# Patient Record
Sex: Female | Born: 1950
Health system: Southern US, Community
[De-identification: ages and names within clinical notes are randomized; demographics above are authoritative.]

## PROBLEM LIST (undated history)

## (undated) DIAGNOSIS — Z923 Personal history of irradiation: Secondary | ICD-10-CM

## (undated) DIAGNOSIS — S060X9A Concussion with loss of consciousness of unspecified duration, initial encounter: Secondary | ICD-10-CM

## (undated) DIAGNOSIS — E785 Hyperlipidemia, unspecified: Secondary | ICD-10-CM

## (undated) DIAGNOSIS — I10 Essential (primary) hypertension: Secondary | ICD-10-CM

## (undated) DIAGNOSIS — C50919 Malignant neoplasm of unspecified site of unspecified female breast: Secondary | ICD-10-CM

## (undated) DIAGNOSIS — F0390 Unspecified dementia without behavioral disturbance: Secondary | ICD-10-CM

## (undated) HISTORY — PX: NO PAST SURGERIES: SHX2092

---
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
## 2000-03-05 ENCOUNTER — Encounter: Admission: RE | Admit: 2000-03-05 | Discharge: 2000-03-05 | Payer: Self-pay | Admitting: Family Medicine

## 2000-03-05 ENCOUNTER — Encounter: Payer: Self-pay | Admitting: Family Medicine

## 2000-04-30 ENCOUNTER — Emergency Department (HOSPITAL_COMMUNITY): Admission: EM | Admit: 2000-04-30 | Discharge: 2000-04-30 | Payer: Self-pay | Admitting: Emergency Medicine

## 2001-12-18 ENCOUNTER — Ambulatory Visit (HOSPITAL_COMMUNITY): Admission: RE | Admit: 2001-12-18 | Discharge: 2001-12-18 | Payer: Self-pay | Admitting: Gastroenterology

## 2004-05-16 ENCOUNTER — Encounter: Admission: RE | Admit: 2004-05-16 | Discharge: 2004-05-16 | Payer: Self-pay | Admitting: Family Medicine

## 2005-11-22 ENCOUNTER — Other Ambulatory Visit: Admission: RE | Admit: 2005-11-22 | Discharge: 2005-11-22 | Payer: Self-pay | Admitting: Family Medicine

## 2009-12-30 ENCOUNTER — Other Ambulatory Visit: Admission: RE | Admit: 2009-12-30 | Discharge: 2009-12-30 | Payer: Self-pay | Admitting: Family Medicine

## 2011-02-17 NOTE — Procedures (Signed)
[facility]  Patient:    Natasha Cobb, Natasha Cobb Visit Number: [id number] MRN: [id number]          Service Type: Attending:  Verlin Grills, M.D. Dictated by:   Verlin Grills, M.D. Proc. Date: 12/18/01   CC:         Blair Heys, M.D., [facility]   Procedure Report  PROCEDURE:  Screening colonoscopy.  REFERRING PHYSICIAN:  Blair Heys, M.D.  PROCEDURE INDICATION:  Natasha Cobb is a 60 year old female, born 1951-04-14.  Her 33 year old sister was diagnosed with colon cancer. Natasha Cobb is scheduled for her first screening colonoscopy with polypectomy to prevent colon cancer.  I discussed with Natasha Cobb the complications associated with colonoscopy and polypectomy, including a 15 per 1000 risk of bleeding and 4 per 1000 risk of colon perforation requiring surgical repair.  Natasha Cobb has signed the operative permit.  ENDOSCOPIST:  Verlin Grills, M.D.  PREMEDICATION:  Versed 10 mg, Demerol 50 mg.  ENDOSCOPE:  Olympus pediatric colonoscope.  DESCRIPTION OF PROCEDURE:  After obtaining informed consent, Natasha Cobb was placed in the left lateral decubitus position.  I administered intravenous Demerol and intravenous Versed to achieve conscious sedation for the procedure.  The patients blood pressure, oxygen saturation, and cardiac rhythm were monitored throughout the procedure and documented in the medical record.  Anal inspection was normal.  Digital rectal exam was normal.  The Olympus pediatric video colonoscope was introduced into the rectum and easily advanced to the cecum.  Colonic preparation for the exam today was excellent.  Natasha Cobb used the Visicol colonic lavage tablets.  RECTUM:  Normal.  SIGMOID COLON AND DESCENDING COLON:  Normal.  SPLENIC FLEXURE:  Normal.  TRANSVERSE COLON:  Normal.  HEPATIC FLEXURE:  Normal.  ASCENDING COLON:  Normal.  CECUM AND ILEOCECAL VALVE:   Normal.  ASSESSMENT: 1. Normal screening proctocolonoscopy to the cecum. 2. No endoscopic evidence for the presence of colorectal neoplasia.  RECOMMENDATIONS:  Repeat colonoscopy in five years. Dictated by:   Verlin Grills, M.D. Attending:  Verlin Grills, M.D. DD:  12/18/01 TD:  12/18/01 Job: [phone] [id number]

## 2012-08-04 ENCOUNTER — Emergency Department (INDEPENDENT_AMBULATORY_CARE_PROVIDER_SITE_OTHER)
Admission: EM | Admit: 2012-08-04 | Discharge: 2012-08-04 | Disposition: A | Discharge status: Home / Self Care | Payer: Self-pay | Source: Home / Self Care | Attending: Emergency Medicine | Admitting: Emergency Medicine

## 2012-08-04 ENCOUNTER — Encounter (HOSPITAL_COMMUNITY): Payer: Self-pay | Admitting: Emergency Medicine

## 2012-08-04 DIAGNOSIS — T148XXA Other injury of unspecified body region, initial encounter: Secondary | ICD-10-CM

## 2012-08-04 DIAGNOSIS — M542 Cervicalgia: Secondary | ICD-10-CM

## 2012-08-04 DIAGNOSIS — M549 Dorsalgia, unspecified: Secondary | ICD-10-CM

## 2012-08-04 HISTORY — DX: Essential (primary) hypertension: I10

## 2012-08-04 MED ORDER — CYCLOBENZAPRINE HCL 10 MG PO TABS: 10.0000 mg | ORAL_TABLET | Freq: Two times a day (BID) | ORAL | Status: DC | PRN

## 2012-08-04 MED ORDER — NAPROXEN 500 MG PO TABS: 500.0000 mg | ORAL_TABLET | Freq: Two times a day (BID) | ORAL | Status: DC

## 2012-08-04 MED ORDER — TRAMADOL HCL 50 MG PO TABS: 50.0000 mg | ORAL_TABLET | Freq: Four times a day (QID) | ORAL | Status: DC | PRN

## 2012-08-04 NOTE — ED Provider Notes (Signed)
Medical screening examination/treatment/procedure(s) were performed by non-physician practitioner and as supervising physician I was immediately available for consultation/collaboration.  Raynald Blend, MD 08/04/12 [phone]
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
## 2013-07-28 ENCOUNTER — Other Ambulatory Visit (HOSPITAL_COMMUNITY)
Admission: RE | Admit: 2013-07-28 | Discharge: 2013-07-28 | Disposition: A | Discharge status: Home / Self Care | Payer: Managed Care, Other (non HMO) | Source: Ambulatory Visit | Attending: Family Medicine | Admitting: Family Medicine

## 2013-07-28 ENCOUNTER — Other Ambulatory Visit: Payer: Self-pay | Admitting: Family Medicine

## 2013-07-28 DIAGNOSIS — Z124 Encounter for screening for malignant neoplasm of cervix: Secondary | ICD-10-CM | POA: Insufficient documentation

## 2015-03-17 ENCOUNTER — Ambulatory Visit
Admission: RE | Admit: 2015-03-17 | Discharge: 2015-03-17 | Disposition: A | Discharge status: Home / Self Care | Payer: Managed Care, Other (non HMO) | Source: Ambulatory Visit | Attending: Family Medicine | Admitting: Family Medicine

## 2015-03-17 ENCOUNTER — Other Ambulatory Visit: Payer: Self-pay | Admitting: Family Medicine

## 2015-03-17 DIAGNOSIS — M25561 Pain in right knee: Secondary | ICD-10-CM

## 2015-08-03 ENCOUNTER — Other Ambulatory Visit: Payer: Self-pay | Admitting: Family Medicine

## 2015-08-03 ENCOUNTER — Other Ambulatory Visit (HOSPITAL_COMMUNITY)
Admission: RE | Admit: 2015-08-03 | Discharge: 2015-08-03 | Disposition: A | Discharge status: Home / Self Care | Payer: Managed Care, Other (non HMO) | Source: Ambulatory Visit | Attending: Family Medicine | Admitting: Family Medicine

## 2015-08-03 DIAGNOSIS — Z01419 Encounter for gynecological examination (general) (routine) without abnormal findings: Secondary | ICD-10-CM | POA: Insufficient documentation

## 2015-08-05 LAB — CYTOLOGY - PAP

## 2015-09-30 ENCOUNTER — Encounter: Payer: Managed Care, Other (non HMO) | Attending: Family Medicine | Admitting: Skilled Nursing Facility1

## 2015-09-30 ENCOUNTER — Encounter: Payer: Self-pay | Admitting: Skilled Nursing Facility1

## 2015-09-30 VITALS — Ht 64.0 in | Wt 170.0 lb

## 2015-09-30 DIAGNOSIS — Z713 Dietary counseling and surveillance: Secondary | ICD-10-CM | POA: Diagnosis not present

## 2015-09-30 DIAGNOSIS — R739 Hyperglycemia, unspecified: Secondary | ICD-10-CM | POA: Insufficient documentation

## 2015-09-30 NOTE — Patient Instructions (Signed)
-  Try to limit your fried foods to twice a week and eventually aim for even less than that -Focus on drinking more water throughout the day -Switch candy to alternative/sugar free  -2 slices of bacon -Try to cut soda down to 2 small cups a day -Every time you drink a soda drink a glass of water -Refer to snack sheet for snack options -Add more vegetables to your meals throughout the day -Small changes, live with the change and when you are used to that change make another change -Try to eat 1 boiled egg in the morning

## 2015-09-30 NOTE — Progress Notes (Signed)
  Medical Nutrition Therapy:  Appt start time: 3:00 end time: 4:00.   Assessment:  Primary concerns today: referred for prediabetes. Pt states her physician referred her and she does not know why she is here. Pt states she loves fried food. Pt lives with her husband. 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 Pt states she gives her father insulin shots every day along with her sister. Pt states she lives with her husband who has severely limited his soda and fried food intake. Dietitian found the Pt found it a little difficult to follow along with the same line of education/questioning.  Pts A1C 6.4.  Preferred Learning Style:   Auditory  Learning Readiness:   Contemplating  MEDICATIONS: See List   DIETARY INTAKE:  Usual eating pattern includes 2 meals and 3 snacks per day.  Everyday foods include none stated.  Avoided foods include none stated.    24-hr recall:  B ( AM): none Snk ( AM): none L ( PM): chicken biscuit Snk ( PM): dessert D ( PM): fish, rice, chicken, potato salad, green beans Snk ( PM): dessert Beverages: soda, water  Usual physical activity: walk 3 days a week for 3 miles  Estimated energy needs: 1600 calories 180 g carbohydrates 120 g protein 44 g fat  Progress Towards Goal(s):  In progress.   Nutritional Diagnosis:  NB-1.1 Food and nutrition-related knowledge deficit As related to newly diagnosed prediabetes.  As evidenced by pt report, incongruent intake of sugary foods, and A1C 6.4.    Intervention:  Nutrition counseling for prediabetes. Dietitian educated the pt on prediabetes/A1C, proper nutrition/balanced meals, and increasing her water consumption. Goals: -Try to limit your fried foods to twice a week and eventually aim for even less than that -Focus on drinking more water throughout the day -Switch candy to alternative/sugar free  -2 slices of bacon -Try to cut soda down to 2 small cups a day -Every time you drink a soda drink a glass of  water -Refer to snack sheet for snack options -Add more vegetables to your meals throughout the day -Small changes, live with the change and when you are used to that change make another change -Try to eat 1 boiled egg in the morning  Teaching Method Utilized:  Visual Auditory Handouts given during visit include:  Snack sheet  MyPlate  Bake, Broil,Grill  Barriers to learning/adherence to lifestyle change: her enjoyment of fried foods  Demonstrated degree of understanding via:  Teach Back   Monitoring/Evaluation:  Dietary intake, exercise, A1C, and body weight prn.

## 2018-02-27 ENCOUNTER — Encounter (HOSPITAL_COMMUNITY): Payer: Self-pay | Admitting: *Deleted

## 2018-02-27 ENCOUNTER — Other Ambulatory Visit: Payer: Self-pay

## 2018-02-27 ENCOUNTER — Emergency Department (HOSPITAL_COMMUNITY): Payer: No Typology Code available for payment source

## 2018-02-27 ENCOUNTER — Emergency Department (HOSPITAL_COMMUNITY)
Admission: EM | Admit: 2018-02-27 | Discharge: 2018-02-27 | Disposition: A | Discharge status: Home / Self Care | Payer: No Typology Code available for payment source | Attending: Emergency Medicine | Admitting: Emergency Medicine

## 2018-02-27 DIAGNOSIS — Z79899 Other long term (current) drug therapy: Secondary | ICD-10-CM | POA: Insufficient documentation

## 2018-02-27 DIAGNOSIS — Z041 Encounter for examination and observation following transport accident: Secondary | ICD-10-CM | POA: Diagnosis not present

## 2018-02-27 DIAGNOSIS — I10 Essential (primary) hypertension: Secondary | ICD-10-CM | POA: Insufficient documentation

## 2018-02-27 DIAGNOSIS — S060X9A Concussion with loss of consciousness of unspecified duration, initial encounter: Secondary | ICD-10-CM | POA: Insufficient documentation

## 2018-02-27 DIAGNOSIS — S060XAA Concussion with loss of consciousness status unknown, initial encounter: Secondary | ICD-10-CM

## 2018-02-27 HISTORY — DX: Concussion with loss of consciousness of unspecified duration, initial encounter: S06.0X9A

## 2018-02-27 HISTORY — DX: Concussion with loss of consciousness status unknown, initial encounter: S06.0XAA

## 2018-02-27 MED ORDER — ACETAMINOPHEN 325 MG PO TABS
650.0000 mg | ORAL_TABLET | Freq: Once | ORAL | Status: AC
  Administered 2018-02-27: 650 mg via ORAL
  Filled 2018-02-27: qty 2

## 2018-02-27 MED ORDER — ACETAMINOPHEN 500 MG PO TABS: 500.0000 mg | ORAL_TABLET | Freq: Four times a day (QID) | ORAL | 0 refills | Status: DC | PRN
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
## 2018-04-30 ENCOUNTER — Other Ambulatory Visit: Payer: Self-pay | Admitting: Family Medicine

## 2018-04-30 DIAGNOSIS — R410 Disorientation, unspecified: Secondary | ICD-10-CM

## 2018-05-02 ENCOUNTER — Encounter: Payer: Self-pay | Admitting: Neurology

## 2018-05-09 ENCOUNTER — Ambulatory Visit
Admission: RE | Admit: 2018-05-09 | Discharge: 2018-05-09 | Disposition: A | Discharge status: Home / Self Care | Payer: BLUE CROSS/BLUE SHIELD | Source: Ambulatory Visit | Attending: Family Medicine | Admitting: Family Medicine

## 2018-05-09 DIAGNOSIS — R410 Disorientation, unspecified: Secondary | ICD-10-CM

## 2018-06-13 ENCOUNTER — Other Ambulatory Visit: Payer: Self-pay | Admitting: Family Medicine

## 2018-06-13 ENCOUNTER — Ambulatory Visit
Admission: RE | Admit: 2018-06-13 | Discharge: 2018-06-13 | Disposition: A | Discharge status: Home / Self Care | Payer: BLUE CROSS/BLUE SHIELD | Source: Ambulatory Visit | Attending: Family Medicine | Admitting: Family Medicine

## 2018-06-13 DIAGNOSIS — R059 Cough, unspecified: Secondary | ICD-10-CM

## 2018-06-13 DIAGNOSIS — R634 Abnormal weight loss: Secondary | ICD-10-CM

## 2018-06-13 DIAGNOSIS — R05 Cough: Secondary | ICD-10-CM

## 2018-06-20 ENCOUNTER — Ambulatory Visit
Admission: RE | Admit: 2018-06-20 | Discharge: 2018-06-20 | Disposition: A | Discharge status: Home / Self Care | Payer: BLUE CROSS/BLUE SHIELD | Source: Ambulatory Visit | Attending: Family Medicine | Admitting: Family Medicine

## 2018-06-20 DIAGNOSIS — R634 Abnormal weight loss: Secondary | ICD-10-CM

## 2018-06-20 MED ORDER — IOPAMIDOL (ISOVUE-300) INJECTION 61%
100.0000 mL | Freq: Once | INTRAVENOUS | Status: AC | PRN
  Administered 2018-06-20: 100 mL via INTRAVENOUS

## 2018-06-27 ENCOUNTER — Ambulatory Visit: Payer: Self-pay | Admitting: Neurology

## 2018-10-02 DIAGNOSIS — H40033 Anatomical narrow angle, bilateral: Secondary | ICD-10-CM | POA: Diagnosis not present

## 2018-10-02 DIAGNOSIS — H5213 Myopia, bilateral: Secondary | ICD-10-CM | POA: Diagnosis not present

## 2018-12-06 DIAGNOSIS — Z0000 Encounter for general adult medical examination without abnormal findings: Secondary | ICD-10-CM | POA: Diagnosis not present

## 2018-12-06 DIAGNOSIS — R413 Other amnesia: Secondary | ICD-10-CM | POA: Diagnosis not present

## 2018-12-06 DIAGNOSIS — R7303 Prediabetes: Secondary | ICD-10-CM | POA: Diagnosis not present

## 2018-12-06 DIAGNOSIS — I700 Atherosclerosis of aorta: Secondary | ICD-10-CM | POA: Diagnosis not present

## 2018-12-06 DIAGNOSIS — E7800 Pure hypercholesterolemia, unspecified: Secondary | ICD-10-CM | POA: Diagnosis not present

## 2018-12-06 DIAGNOSIS — I10 Essential (primary) hypertension: Secondary | ICD-10-CM | POA: Diagnosis not present

## 2018-12-12 ENCOUNTER — Encounter: Payer: Self-pay | Admitting: Neurology

## 2019-01-14 ENCOUNTER — Encounter: Payer: Self-pay | Admitting: Neurology

## 2019-02-17 ENCOUNTER — Encounter: Payer: Self-pay | Admitting: Neurology

## 2019-02-27 ENCOUNTER — Ambulatory Visit: Payer: Self-pay | Admitting: Neurology

## 2019-03-31 ENCOUNTER — Ambulatory Visit: Payer: BLUE CROSS/BLUE SHIELD | Admitting: Neurology

## 2019-04-01 DIAGNOSIS — H40033 Anatomical narrow angle, bilateral: Secondary | ICD-10-CM | POA: Diagnosis not present

## 2019-04-01 DIAGNOSIS — H2513 Age-related nuclear cataract, bilateral: Secondary | ICD-10-CM | POA: Diagnosis not present

## 2019-04-30 ENCOUNTER — Encounter

## 2019-04-30 ENCOUNTER — Encounter: Payer: Self-pay | Admitting: Neurology

## 2019-04-30 ENCOUNTER — Ambulatory Visit (INDEPENDENT_AMBULATORY_CARE_PROVIDER_SITE_OTHER): Payer: Medicare HMO | Admitting: Neurology

## 2019-04-30 ENCOUNTER — Other Ambulatory Visit: Payer: Self-pay

## 2019-04-30 ENCOUNTER — Other Ambulatory Visit (INDEPENDENT_AMBULATORY_CARE_PROVIDER_SITE_OTHER): Payer: Medicare HMO

## 2019-04-30 VITALS — BP 166/74 | HR 72 | Ht 64.5 in | Wt 151.0 lb

## 2019-04-30 DIAGNOSIS — R413 Other amnesia: Secondary | ICD-10-CM

## 2019-04-30 LAB — SEDIMENTATION RATE: Sed Rate: 2 mm/h (ref 0–30)

## 2019-04-30 MED ORDER — DONEPEZIL HCL 10 MG PO TABS: ORAL_TABLET | ORAL | 11 refills | Status: DC
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
## 2019-04-30 NOTE — Patient Instructions (Addendum)
1. Schedule MRI brain with and without contrast. This will be at [facility]. They will call you with an appointment date and time. There office number is [phone] if you would like to schedule on your own.  2. Bloodwork TSH, B12, RPR, ammonia, ESR, CRP, ANA, anti-thyroglobulin antibodies, anti-thyroid peroxidase antibodies.  Please stop by the front desk and check in for lab work. You will go to the 2nd floor of our building to the Endocrinology office to have labs drawn. We will call you with the results.  3. Schedule Neurocognitive testing. This will be scheduled with Dr. Melvyn Novas in our office. We will contact you with an appointment date and time.  4. Start Donepezil (Aricept) 42m: take 1/2 tablet daily for 2 weeks, then increase to 1 tablet daily  5. Follow-up after memory testing with Dr. MMelvyn Novas Our office will schedule this.  FALL PRECAUTIONS: Be cautious when walking. Scan the area for obstacles that may increase the risk of trips and falls. When getting up in the mornings, sit up at the edge of the bed for a few minutes before getting out of bed. Consider elevating the bed at the head end to avoid drop of blood pressure when getting up. Walk always in a well-lit room (use night lights in the walls). Avoid area rugs or power cords from appliances in the middle of the walkways. Use a walker or a cane if necessary and consider physical therapy for balance exercise. Get your eyesight checked regularly.  FINANCIAL OVERSIGHT: Supervision, especially oversight when making financial decisions or transactions is also recommended.  HOME SAFETY: Consider the safety of the kitchen when operating appliances like stoves, microwave oven, and blender. Consider having supervision and share cooking responsibilities until no longer able to participate in those. Accidents with firearms and other hazards in the house should be identified and addressed as well.  DRIVING: Regarding driving, in patients  with progressive memory problems, driving will be impaired. We advise to have someone else do the driving if trouble finding directions or if minor accidents are reported. Independent driving assessment is available to determine safety of driving.  ABILITY TO BE LEFT ALONE: If patient is unable to contact 911 operator, consider using LifeLine, or when the need is there, arrange for someone to stay with patients. Smoking is a fire hazard, consider supervision or cessation. Risk of wandering should be assessed by caregiver and if detected at any point, supervision and safe proof recommendations should be instituted.  MEDICATION SUPERVISION: Inability to self-administer medication needs to be constantly addressed. Implement a mechanism to ensure safe administration of the medications.  RECOMMENDATIONS FOR ALL PATIENTS WITH MEMORY PROBLEMS: 1. Continue to exercise (Recommend 30 minutes of walking everyday, or 3 hours every week) 2. Increase social interactions - continue going to [place] enjoy social gatherings with friends and family 3. Eat healthy, avoid fried foods and eat more fruits and vegetables 4. Maintain adequate blood pressure, blood sugar, and blood cholesterol level. Reducing the risk of stroke and cardiovascular disease also helps promoting better memory. 5. Avoid stressful situations. Live a simple life and avoid aggravations. Organize your time and prepare for the next day in anticipation. 6. Sleep well, avoid any interruptions of sleep and avoid any distractions in the bedroom that may interfere with adequate sleep quality 7. Avoid sugar, avoid sweets as there is a strong link between excessive sugar intake, diabetes, and cognitive impairment We discussed the Mediterranean diet, which has been shown to help patients reduce the  risk of progressive memory disorders and reduces cardiovascular risk. This includes eating fish, eat fruits and green leafy vegetables, nuts like almonds and  hazelnuts, walnuts, and also use olive oil. Avoid fast foods and fried foods as much as possible. Avoid sweets and sugar as sugar use has been linked to worsening of memory function.

## 2019-05-02 LAB — RPR: RPR Ser Ql: NONREACTIVE

## 2019-05-02 LAB — VITAMIN B12: Vitamin B-12: 390 pg/mL (ref 200–1100)

## 2019-05-02 LAB — AMMONIA: Ammonia: 33 umol/L (ref <=72)

## 2019-05-02 LAB — C-REACTIVE PROTEIN: CRP: 1.1 mg/L (ref <8.0)

## 2019-05-02 LAB — TSH: TSH: 1.81 mIU/L (ref 0.40–4.50)

## 2019-05-02 LAB — THYROGLOBULIN LEVEL: Thyroglobulin: 23 ng/mL

## 2019-05-02 LAB — THYROID PEROXIDASE ANTIBODY: Thyroperoxidase Ab SerPl-aCnc: 1 IU/mL (ref <9)

## 2019-05-02 LAB — ANA: Anti Nuclear Antibody (ANA): NEGATIVE

## 2019-05-05 ENCOUNTER — Telehealth: Payer: Self-pay

## 2019-05-05 NOTE — Telephone Encounter (Signed)
-----   Message from Cameron Sprang, MD sent at 05/05/2019  9:59 AM EDT ----- Pls let patient know the bloodwork was normal. Proceed with MRI brain as discussed, thanks

## 2019-05-05 NOTE — Telephone Encounter (Signed)
Pt informed of normal labs and made aware to keep MRI appt. Pt agreed.

## 2019-05-09 DIAGNOSIS — Z1231 Encounter for screening mammogram for malignant neoplasm of breast: Secondary | ICD-10-CM | POA: Diagnosis not present

## 2019-05-09 DIAGNOSIS — Z803 Family history of malignant neoplasm of breast: Secondary | ICD-10-CM | POA: Diagnosis not present

## 2019-05-16 ENCOUNTER — Ambulatory Visit
Admission: RE | Admit: 2019-05-16 | Discharge: 2019-05-16 | Disposition: A | Discharge status: Home / Self Care | Payer: Medicare HMO | Source: Ambulatory Visit | Attending: Neurology | Admitting: Neurology

## 2019-05-16 ENCOUNTER — Other Ambulatory Visit: Payer: Self-pay

## 2019-05-16 DIAGNOSIS — R413 Other amnesia: Secondary | ICD-10-CM

## 2019-05-16 MED ORDER — GADOBENATE DIMEGLUMINE 529 MG/ML IV SOLN
13.0000 mL | Freq: Once | INTRAVENOUS | Status: AC | PRN
  Administered 2019-05-16: 13 mL via INTRAVENOUS

## 2019-05-19 ENCOUNTER — Ambulatory Visit: Payer: Medicare HMO | Admitting: Psychology

## 2019-05-19 ENCOUNTER — Other Ambulatory Visit: Payer: Self-pay

## 2019-05-19 ENCOUNTER — Encounter: Payer: Self-pay | Admitting: Psychology

## 2019-05-19 ENCOUNTER — Ambulatory Visit (INDEPENDENT_AMBULATORY_CARE_PROVIDER_SITE_OTHER): Payer: Medicare HMO | Admitting: Psychology

## 2019-05-19 ENCOUNTER — Telehealth: Payer: Self-pay

## 2019-05-19 DIAGNOSIS — R419 Unspecified symptoms and signs involving cognitive functions and awareness: Secondary | ICD-10-CM | POA: Diagnosis not present

## 2019-05-19 DIAGNOSIS — S060X0S Concussion without loss of consciousness, sequela: Secondary | ICD-10-CM

## 2019-05-19 DIAGNOSIS — R413 Other amnesia: Secondary | ICD-10-CM

## 2019-05-19 NOTE — Telephone Encounter (Signed)
Pt informed of normal MRI results. Confirmed to proceed with Neurocognitive testing.

## 2019-05-19 NOTE — Progress Notes (Signed)
   Neuropsychology Note   Natasha Cobb completed 120 minutes of neuropsychological testing with technician, Milana Kidney, B.S., under the supervision of Dr. Christia Reading, Ph.D., licensed psychologist. The patient did not appear overtly distressed by the testing session, per behavioral observation or via self-report to the technician. Rest breaks were offered.    In considering the patient's current level of functioning, level of presumed impairment, nature of symptoms, emotional and behavioral responses during the interview, level of literacy, and observed level of motivation/effort, a battery of tests was selected and communicated to the psychometrician.   Communication between the psychologist and technician was ongoing throughout the testing session and changes were made as deemed necessary based on patient performance on testing, technician observations and additional pertinent factors such as those listed above.   Natasha Cobb will return within approximately two weeks for an interactive feedback session with Dr. Melvyn Novas at which time his test performances, clinical impressions, and treatment recommendations will be reviewed in detail. The patient understands she can contact our office should she require our assistance before this time.   Full report to follow.  120 minutes were spent face-to-face with patient administering standardized tests and 15 minutes were spent scoring (technician). [CPT [id number], [id number]]
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
## 2019-05-19 NOTE — Telephone Encounter (Signed)
-----   Message from Cameron Sprang, MD sent at 05/18/2019 11:31 PM EDT ----- Pls let patient know I reviewed MRI brain, no evidence of tumor, stroke, or bleed. It shows age-related changes. Proceed with Neurocognitive testing as discussed. Thanks

## 2019-05-26 ENCOUNTER — Encounter: Payer: Self-pay | Admitting: Psychology

## 2019-05-26 ENCOUNTER — Ambulatory Visit (INDEPENDENT_AMBULATORY_CARE_PROVIDER_SITE_OTHER): Payer: Medicare HMO | Admitting: Psychology

## 2019-05-26 ENCOUNTER — Other Ambulatory Visit: Payer: Self-pay

## 2019-05-26 DIAGNOSIS — R413 Other amnesia: Secondary | ICD-10-CM | POA: Diagnosis not present

## 2019-05-26 DIAGNOSIS — R4189 Other symptoms and signs involving cognitive functions and awareness: Secondary | ICD-10-CM | POA: Diagnosis not present

## 2019-05-26 DIAGNOSIS — S060X0S Concussion without loss of consciousness, sequela: Secondary | ICD-10-CM | POA: Diagnosis not present
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
## 2019-05-30 ENCOUNTER — Other Ambulatory Visit: Payer: Medicare HMO

## 2019-06-10 DIAGNOSIS — R413 Other amnesia: Secondary | ICD-10-CM | POA: Diagnosis not present

## 2019-06-10 DIAGNOSIS — I10 Essential (primary) hypertension: Secondary | ICD-10-CM | POA: Diagnosis not present

## 2019-06-10 DIAGNOSIS — I700 Atherosclerosis of aorta: Secondary | ICD-10-CM | POA: Diagnosis not present

## 2019-06-10 DIAGNOSIS — R634 Abnormal weight loss: Secondary | ICD-10-CM | POA: Diagnosis not present

## 2019-06-10 DIAGNOSIS — R7303 Prediabetes: Secondary | ICD-10-CM | POA: Diagnosis not present

## 2019-06-10 DIAGNOSIS — E7800 Pure hypercholesterolemia, unspecified: Secondary | ICD-10-CM | POA: Diagnosis not present

## 2019-06-19 ENCOUNTER — Encounter: Payer: Self-pay | Admitting: Neurology

## 2019-06-24 ENCOUNTER — Telehealth (INDEPENDENT_AMBULATORY_CARE_PROVIDER_SITE_OTHER): Payer: Medicare HMO | Admitting: Neurology

## 2019-06-24 ENCOUNTER — Other Ambulatory Visit: Payer: Self-pay

## 2019-06-24 VITALS — Ht 64.0 in | Wt 150.0 lb

## 2019-06-24 DIAGNOSIS — R413 Other amnesia: Secondary | ICD-10-CM
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
## 2019-06-27 MED ORDER — DONEPEZIL HCL 10 MG PO TABS: ORAL_TABLET | ORAL | 11 refills | Status: DC

## 2019-07-02 ENCOUNTER — Telehealth: Payer: Self-pay

## 2019-07-02 ENCOUNTER — Other Ambulatory Visit: Payer: Self-pay

## 2019-07-02 DIAGNOSIS — S060X0S Concussion without loss of consciousness, sequela: Secondary | ICD-10-CM

## 2019-07-02 DIAGNOSIS — R413 Other amnesia: Secondary | ICD-10-CM

## 2019-07-02 NOTE — Telephone Encounter (Signed)
Pt is scheduled for  Lumbar puncture at [facility] on 07/07/19 at 9 am. Pt will need to arrive at the [facility] entrance at 8:30 am. The radiology dept will call her to go over restrictions today or tomorrow per Manuela Schwartz in [employer].  Left message informing pt of appt date and time and to call with any concerns.  [facility] lab for Paraneoplastic ATB's faxed to [facility] at [phone]

## 2019-07-07 ENCOUNTER — Ambulatory Visit (HOSPITAL_COMMUNITY)
Admission: RE | Admit: 2019-07-07 | Discharge: 2019-07-07 | Disposition: A | Discharge status: Home / Self Care | Payer: Medicare HMO | Source: Ambulatory Visit | Attending: Neurology | Admitting: Neurology

## 2019-07-07 ENCOUNTER — Other Ambulatory Visit: Payer: Self-pay

## 2019-07-07 DIAGNOSIS — S060X9S Concussion with loss of consciousness of unspecified duration, sequela: Secondary | ICD-10-CM | POA: Diagnosis not present

## 2019-07-07 DIAGNOSIS — S060X0S Concussion without loss of consciousness, sequela: Secondary | ICD-10-CM | POA: Diagnosis not present

## 2019-07-07 DIAGNOSIS — S060X0A Concussion without loss of consciousness, initial encounter: Secondary | ICD-10-CM | POA: Diagnosis not present

## 2019-07-07 DIAGNOSIS — R413 Other amnesia: Secondary | ICD-10-CM | POA: Diagnosis not present

## 2019-07-07 LAB — GLUCOSE, CSF: Glucose, CSF: 55 mg/dL (ref 40–70)

## 2019-07-07 LAB — CSF CELL COUNT WITH DIFFERENTIAL
RBC Count, CSF: 36 /mm3 — ABNORMAL HIGH
Tube #: 3
WBC, CSF: 2 /mm3 (ref 0–5)

## 2019-07-07 LAB — PROTEIN, CSF: Total  Protein, CSF: 23 mg/dL (ref 15–45)

## 2019-07-07 LAB — CRYPTOCOCCAL ANTIGEN, CSF: Crypto Ag: NEGATIVE

## 2019-07-07 MED ORDER — LIDOCAINE HCL (PF) 1 % IJ SOLN: 5.0000 mL | Freq: Once | INTRAMUSCULAR | Status: DC

## 2019-07-07 NOTE — Procedures (Signed)
Uncomplicated lumbar puncture at L3-L4 using fluoroscopic guidance

## 2019-07-07 NOTE — Progress Notes (Signed)
Discharge instructions reviewed with pt and her husband. Both voice understanding.  

## 2019-07-07 NOTE — Discharge Instructions (Signed)
Lumbar Puncture, Care After °This sheet gives you information about how to care for yourself after your procedure. Your health care provider may also give you more specific instructions. If you have problems or questions, contact your health care provider. °What can I expect after the procedure? °After the procedure, it is common to have: °· Mild discomfort or pain at the puncture site. °· A mild headache that is relieved with pain medicines. °Follow these instructions at home: °Activity ° °· Lie down flat or rest for as long as directed by your health care provider. °· Return to your normal activities as told by your health care provider. Ask your health care provider what activities are safe for you. °· Avoid lifting anything heavier than 10 lb (4.5 kg) for at least 12 hours after the procedure. °· Do not drive for 24 hours if you were given a medicine to help you relax (sedative) during your procedure. °· Do not drive or use heavy machinery while taking prescription pain medicine. °Puncture site care °· Remove or change your bandage (dressing) as told by your health care provider. °· Check your puncture area every day for signs of infection. Check for: °? More pain. °? Redness or swelling. °? Fluid or blood leaking from the puncture site. °? Warmth. °? Pus or a bad smell. °General instructions °· Take over-the-counter and prescription medicines only as told by your health care provider. °· Drink enough fluids to keep your urine clear or pale yellow. Your health care provider may recommend drinking caffeine to prevent a headache. °· Keep all follow-up visits as told by your health care provider. This is important. °Contact a health care provider if: °· You have fever or chills. °· You have nausea or vomiting. °· You have a headache that lasts for more than 2 days or does not get better with medicine. °Get help right away if: °· You develop any of the following in your  legs: °? Weakness. °? Numbness. °? Tingling. °· You are unable to control when you urinate or have a bowel movement (incontinence). °· You have signs of infection around your puncture site, such as: °? More pain. °? Redness or swelling. °? Fluid or blood leakage. °? Warmth. °? Pus or a bad smell. °· You are dizzy or you feel like you might faint. °· You have a severe headache, especially when you sit or stand. °Summary °· A lumbar puncture is a procedure in which a small needle is inserted into the lower back to remove fluid that surrounds the brain and spinal cord. °· After this procedure, it is common to have a headache and pain around the needle insertion area. °· Lying flat, staying hydrated, and drinking caffeine can help prevent headaches. °· Monitor your needle insertion site for signs of infection, including warmth, fluid, or more pain. °· Get help right away if you develop leg weakness, leg numbness, incontinence, or severe headaches. °This information is not intended to replace advice given to you by your health care provider. Make sure you discuss any questions you have with your health care provider. °Document Released: 09/23/2013 Document Revised: 11/01/2016 Document Reviewed: 11/01/2016 °Elsevier Patient Education © 2020 [employer] ° °

## 2019-07-08 LAB — HSV DNA BY PCR (REFERENCE LAB)
HSV 1 DNA: NEGATIVE
HSV 2 DNA: NEGATIVE

## 2019-07-08 LAB — CYTOLOGY - NON PAP

## 2019-07-11 LAB — CSF CULTURE W GRAM STAIN
Culture: NO GROWTH
Gram Stain: NONE SEEN

## 2019-08-06 LAB — FUNGUS CULTURE RESULT

## 2019-08-06 LAB — FUNGAL ORGANISM REFLEX

## 2019-08-06 LAB — FUNGUS CULTURE WITH STAIN

## 2019-12-09 DIAGNOSIS — I10 Essential (primary) hypertension: Secondary | ICD-10-CM | POA: Diagnosis not present

## 2019-12-09 DIAGNOSIS — E7800 Pure hypercholesterolemia, unspecified: Secondary | ICD-10-CM | POA: Diagnosis not present

## 2019-12-09 DIAGNOSIS — Z0000 Encounter for general adult medical examination without abnormal findings: Secondary | ICD-10-CM | POA: Diagnosis not present

## 2019-12-09 DIAGNOSIS — R413 Other amnesia: Secondary | ICD-10-CM | POA: Diagnosis not present

## 2019-12-09 DIAGNOSIS — I700 Atherosclerosis of aorta: Secondary | ICD-10-CM | POA: Diagnosis not present

## 2019-12-09 DIAGNOSIS — R7303 Prediabetes: Secondary | ICD-10-CM | POA: Diagnosis not present
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
## 2020-06-09 ENCOUNTER — Other Ambulatory Visit: Payer: Self-pay | Admitting: *Deleted

## 2020-06-09 DIAGNOSIS — Z1231 Encounter for screening mammogram for malignant neoplasm of breast: Secondary | ICD-10-CM

## 2020-06-10 DIAGNOSIS — I700 Atherosclerosis of aorta: Secondary | ICD-10-CM | POA: Diagnosis not present

## 2020-06-10 DIAGNOSIS — Z1239 Encounter for other screening for malignant neoplasm of breast: Secondary | ICD-10-CM | POA: Diagnosis not present

## 2020-06-10 DIAGNOSIS — R413 Other amnesia: Secondary | ICD-10-CM | POA: Diagnosis not present

## 2020-06-10 DIAGNOSIS — I10 Essential (primary) hypertension: Secondary | ICD-10-CM | POA: Diagnosis not present

## 2020-06-10 DIAGNOSIS — R7303 Prediabetes: Secondary | ICD-10-CM | POA: Diagnosis not present

## 2020-06-10 DIAGNOSIS — E7800 Pure hypercholesterolemia, unspecified: Secondary | ICD-10-CM | POA: Diagnosis not present

## 2020-06-15 DIAGNOSIS — Z1231 Encounter for screening mammogram for malignant neoplasm of breast: Secondary | ICD-10-CM | POA: Diagnosis not present

## 2020-07-01 DIAGNOSIS — R928 Other abnormal and inconclusive findings on diagnostic imaging of breast: Secondary | ICD-10-CM | POA: Diagnosis not present

## 2020-07-12 ENCOUNTER — Other Ambulatory Visit: Payer: Self-pay | Admitting: Neurology

## 2020-08-04 ENCOUNTER — Other Ambulatory Visit: Payer: Self-pay | Admitting: Neurology

## 2020-08-05 ENCOUNTER — Telehealth: Payer: Self-pay | Admitting: Neurology

## 2020-08-05 ENCOUNTER — Other Ambulatory Visit: Payer: Self-pay

## 2020-08-05 MED ORDER — DONEPEZIL HCL 10 MG PO TABS: ORAL_TABLET | ORAL | 8 refills | Status: DC

## 2020-08-05 NOTE — Telephone Encounter (Signed)
Refill sent in

## 2020-08-05 NOTE — Telephone Encounter (Signed)
Patient called in needing a refill of her donepezil. I scheduled the patient for 04/12/21.

## 2020-08-05 NOTE — Telephone Encounter (Signed)
Ok to send refills until next appt, thanks!

## 2020-12-09 DIAGNOSIS — Z0000 Encounter for general adult medical examination without abnormal findings: Secondary | ICD-10-CM | POA: Diagnosis not present

## 2020-12-09 DIAGNOSIS — I10 Essential (primary) hypertension: Secondary | ICD-10-CM | POA: Diagnosis not present

## 2020-12-09 DIAGNOSIS — R7303 Prediabetes: Secondary | ICD-10-CM | POA: Diagnosis not present

## 2020-12-09 DIAGNOSIS — I700 Atherosclerosis of aorta: Secondary | ICD-10-CM | POA: Diagnosis not present

## 2020-12-09 DIAGNOSIS — R413 Other amnesia: Secondary | ICD-10-CM | POA: Diagnosis not present

## 2020-12-09 DIAGNOSIS — Z1389 Encounter for screening for other disorder: Secondary | ICD-10-CM | POA: Diagnosis not present

## 2020-12-09 DIAGNOSIS — E7800 Pure hypercholesterolemia, unspecified: Secondary | ICD-10-CM | POA: Diagnosis not present

## 2020-12-14 DIAGNOSIS — R921 Mammographic calcification found on diagnostic imaging of breast: Secondary | ICD-10-CM | POA: Diagnosis not present

## 2020-12-14 DIAGNOSIS — R928 Other abnormal and inconclusive findings on diagnostic imaging of breast: Secondary | ICD-10-CM | POA: Diagnosis not present

## 2020-12-29 DIAGNOSIS — R921 Mammographic calcification found on diagnostic imaging of breast: Secondary | ICD-10-CM | POA: Diagnosis not present

## 2020-12-29 DIAGNOSIS — R922 Inconclusive mammogram: Secondary | ICD-10-CM | POA: Diagnosis not present

## 2020-12-29 DIAGNOSIS — D241 Benign neoplasm of right breast: Secondary | ICD-10-CM | POA: Diagnosis not present

## 2021-02-07 ENCOUNTER — Other Ambulatory Visit: Payer: Self-pay | Admitting: Surgery

## 2021-02-07 DIAGNOSIS — D241 Benign neoplasm of right breast: Secondary | ICD-10-CM

## 2021-03-17 ENCOUNTER — Encounter (HOSPITAL_BASED_OUTPATIENT_CLINIC_OR_DEPARTMENT_OTHER): Payer: Self-pay | Admitting: Surgery

## 2021-03-17 ENCOUNTER — Other Ambulatory Visit: Payer: Self-pay

## 2021-03-28 ENCOUNTER — Encounter (HOSPITAL_BASED_OUTPATIENT_CLINIC_OR_DEPARTMENT_OTHER)
Admission: RE | Admit: 2021-03-28 | Discharge: 2021-03-28 | Disposition: A | Discharge status: Home / Self Care | Payer: Medicare HMO | Source: Ambulatory Visit | Attending: Surgery | Admitting: Surgery

## 2021-03-28 ENCOUNTER — Other Ambulatory Visit (HOSPITAL_COMMUNITY): Payer: Medicare HMO

## 2021-03-28 DIAGNOSIS — Z01810 Encounter for preprocedural cardiovascular examination: Secondary | ICD-10-CM | POA: Diagnosis not present

## 2021-03-28 NOTE — Progress Notes (Signed)

## 2021-03-30 DIAGNOSIS — R921 Mammographic calcification found on diagnostic imaging of breast: Secondary | ICD-10-CM | POA: Diagnosis not present
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
## 2021-03-31 ENCOUNTER — Ambulatory Visit (HOSPITAL_BASED_OUTPATIENT_CLINIC_OR_DEPARTMENT_OTHER)
Admission: RE | Admit: 2021-03-31 | Discharge: 2021-03-31 | Disposition: A | Discharge status: Home / Self Care | Payer: Medicare HMO | Attending: Surgery | Admitting: Surgery

## 2021-03-31 ENCOUNTER — Ambulatory Visit (HOSPITAL_BASED_OUTPATIENT_CLINIC_OR_DEPARTMENT_OTHER): Payer: Medicare HMO | Admitting: Anesthesiology

## 2021-03-31 ENCOUNTER — Other Ambulatory Visit: Payer: Self-pay

## 2021-03-31 ENCOUNTER — Encounter (HOSPITAL_BASED_OUTPATIENT_CLINIC_OR_DEPARTMENT_OTHER): Payer: Self-pay | Admitting: Surgery

## 2021-03-31 ENCOUNTER — Encounter (HOSPITAL_BASED_OUTPATIENT_CLINIC_OR_DEPARTMENT_OTHER)
Admission: RE | Disposition: A | Discharge status: Home / Self Care | Payer: Self-pay | Source: Home / Self Care | Attending: Surgery

## 2021-03-31 DIAGNOSIS — D241 Benign neoplasm of right breast: Secondary | ICD-10-CM | POA: Diagnosis not present

## 2021-03-31 DIAGNOSIS — D0511 Intraductal carcinoma in situ of right breast: Secondary | ICD-10-CM | POA: Diagnosis not present

## 2021-03-31 DIAGNOSIS — E785 Hyperlipidemia, unspecified: Secondary | ICD-10-CM | POA: Diagnosis not present

## 2021-03-31 DIAGNOSIS — Z79899 Other long term (current) drug therapy: Secondary | ICD-10-CM | POA: Insufficient documentation

## 2021-03-31 DIAGNOSIS — R928 Other abnormal and inconclusive findings on diagnostic imaging of breast: Secondary | ICD-10-CM | POA: Diagnosis not present

## 2021-03-31 DIAGNOSIS — R921 Mammographic calcification found on diagnostic imaging of breast: Secondary | ICD-10-CM | POA: Diagnosis not present

## 2021-03-31 DIAGNOSIS — Z803 Family history of malignant neoplasm of breast: Secondary | ICD-10-CM | POA: Insufficient documentation

## 2021-03-31 DIAGNOSIS — N6489 Other specified disorders of breast: Secondary | ICD-10-CM | POA: Diagnosis not present

## 2021-03-31 DIAGNOSIS — I10 Essential (primary) hypertension: Secondary | ICD-10-CM | POA: Diagnosis not present

## 2021-03-31 HISTORY — PX: BREAST LUMPECTOMY WITH RADIOACTIVE SEED LOCALIZATION: SHX6424

## 2021-03-31 HISTORY — DX: Unspecified dementia, unspecified severity, without behavioral disturbance, psychotic disturbance, mood disturbance, and anxiety: F03.90

## 2021-03-31 HISTORY — DX: Hyperlipidemia, unspecified: E78.5

## 2021-03-31 SURGERY — BREAST LUMPECTOMY WITH RADIOACTIVE SEED LOCALIZATION
Anesthesia: General | Site: Breast | Laterality: Right

## 2021-03-31 MED ORDER — CHLORHEXIDINE GLUCONATE CLOTH 2 % EX PADS: 6.0000 | MEDICATED_PAD | Freq: Once | CUTANEOUS | Status: DC

## 2021-03-31 MED ORDER — MEPERIDINE HCL 25 MG/ML IJ SOLN: 6.2500 mg | INTRAMUSCULAR | Status: DC | PRN

## 2021-03-31 MED ORDER — FENTANYL CITRATE (PF) 100 MCG/2ML IJ SOLN
INTRAMUSCULAR | Status: DC | PRN
  Administered 2021-03-31 (×2): 50 ug via INTRAVENOUS

## 2021-03-31 MED ORDER — EPHEDRINE 5 MG/ML INJ
INTRAVENOUS | Status: AC
  Filled 2021-03-31: qty 10

## 2021-03-31 MED ORDER — ONDANSETRON HCL 4 MG/2ML IJ SOLN
INTRAMUSCULAR | Status: AC
  Filled 2021-03-31: qty 2

## 2021-03-31 MED ORDER — CEFAZOLIN SODIUM-DEXTROSE 2-4 GM/100ML-% IV SOLN
INTRAVENOUS | Status: AC
  Filled 2021-03-31: qty 100

## 2021-03-31 MED ORDER — LACTATED RINGERS IV SOLN: INTRAVENOUS | Status: DC

## 2021-03-31 MED ORDER — DEXAMETHASONE SODIUM PHOSPHATE 4 MG/ML IJ SOLN
INTRAMUSCULAR | Status: DC | PRN
  Administered 2021-03-31: 4 mg via INTRAVENOUS

## 2021-03-31 MED ORDER — AMISULPRIDE (ANTIEMETIC) 5 MG/2ML IV SOLN: 10.0000 mg | Freq: Once | INTRAVENOUS | Status: DC | PRN

## 2021-03-31 MED ORDER — BUPIVACAINE-EPINEPHRINE 0.5% -1:200000 IJ SOLN
INTRAMUSCULAR | Status: DC | PRN
  Administered 2021-03-31: 15 mL

## 2021-03-31 MED ORDER — TRAMADOL HCL 50 MG PO TABS: 50.0000 mg | ORAL_TABLET | Freq: Four times a day (QID) | ORAL | 0 refills | Status: DC | PRN

## 2021-03-31 MED ORDER — ONDANSETRON HCL 4 MG/2ML IJ SOLN
INTRAMUSCULAR | Status: DC | PRN
  Administered 2021-03-31: 4 mg via INTRAVENOUS

## 2021-03-31 MED ORDER — ENSURE PRE-SURGERY PO LIQD: 296.0000 mL | Freq: Once | ORAL | Status: DC

## 2021-03-31 MED ORDER — ACETAMINOPHEN 500 MG PO TABS
1000.0000 mg | ORAL_TABLET | ORAL | Status: AC
  Administered 2021-03-31: 1000 mg via ORAL

## 2021-03-31 MED ORDER — CEFAZOLIN SODIUM-DEXTROSE 2-4 GM/100ML-% IV SOLN
2.0000 g | INTRAVENOUS | Status: AC
  Administered 2021-03-31: 2 g via INTRAVENOUS

## 2021-03-31 MED ORDER — PROMETHAZINE HCL 25 MG/ML IJ SOLN: 6.2500 mg | INTRAMUSCULAR | Status: DC | PRN

## 2021-03-31 MED ORDER — EPHEDRINE SULFATE 50 MG/ML IJ SOLN
INTRAMUSCULAR | Status: DC | PRN
  Administered 2021-03-31 (×3): 10 mg via INTRAVENOUS

## 2021-03-31 MED ORDER — PROPOFOL 10 MG/ML IV BOLUS
INTRAVENOUS | Status: DC | PRN
  Administered 2021-03-31: 130 mg via INTRAVENOUS

## 2021-03-31 MED ORDER — DEXAMETHASONE SODIUM PHOSPHATE 10 MG/ML IJ SOLN
INTRAMUSCULAR | Status: AC
  Filled 2021-03-31: qty 1

## 2021-03-31 MED ORDER — FENTANYL CITRATE (PF) 100 MCG/2ML IJ SOLN
INTRAMUSCULAR | Status: AC
  Filled 2021-03-31: qty 2

## 2021-03-31 MED ORDER — OXYCODONE HCL 5 MG/5ML PO SOLN: 5.0000 mg | Freq: Once | ORAL | Status: DC | PRN

## 2021-03-31 MED ORDER — HYDROMORPHONE HCL 1 MG/ML IJ SOLN: 0.2500 mg | INTRAMUSCULAR | Status: DC | PRN

## 2021-03-31 MED ORDER — OXYCODONE HCL 5 MG PO TABS: 5.0000 mg | ORAL_TABLET | Freq: Once | ORAL | Status: DC | PRN

## 2021-03-31 MED ORDER — ACETAMINOPHEN 500 MG PO TABS
ORAL_TABLET | ORAL | Status: AC
  Filled 2021-03-31: qty 5

## 2021-03-31 MED ORDER — LIDOCAINE HCL (PF) 2 % IJ SOLN
INTRAMUSCULAR | Status: AC
  Filled 2021-03-31: qty 5

## 2021-03-31 SURGICAL SUPPLY — 44 items
ADH SKN CLS APL DERMABOND .7 (GAUZE/BANDAGES/DRESSINGS) ×1
APL PRP STRL LF DISP 70% ISPRP (MISCELLANEOUS) ×1
APPLIER CLIP 9.375 MED OPEN (MISCELLANEOUS)
APR CLP MED 9.3 20 MLT OPN (MISCELLANEOUS)
BINDER BREAST LRG (GAUZE/BANDAGES/DRESSINGS) IMPLANT
BLADE SURG 15 STRL LF DISP TIS (BLADE) ×1 IMPLANT
BLADE SURG 15 STRL SS (BLADE) ×2
CANISTER SUC SOCK COL 7IN (MISCELLANEOUS) IMPLANT
CANISTER SUCT 1200ML W/VALVE (MISCELLANEOUS) IMPLANT
CHLORAPREP W/TINT 26 (MISCELLANEOUS) ×2 IMPLANT
CLIP APPLIE 9.375 MED OPEN (MISCELLANEOUS) IMPLANT
COVER BACK TABLE 60X90IN (DRAPES) ×2 IMPLANT
COVER MAYO STAND STRL (DRAPES) ×2 IMPLANT
COVER PROBE W GEL 5X96 (DRAPES) ×2 IMPLANT
DECANTER SPIKE VIAL GLASS SM (MISCELLANEOUS) IMPLANT
DERMABOND ADVANCED (GAUZE/BANDAGES/DRESSINGS) ×1
DERMABOND ADVANCED .7 DNX12 (GAUZE/BANDAGES/DRESSINGS) ×1 IMPLANT
DRAPE LAPAROSCOPIC ABDOMINAL (DRAPES) ×2 IMPLANT
DRAPE UTILITY XL STRL (DRAPES) ×2 IMPLANT
ELECT REM PT RETURN 9FT ADLT (ELECTROSURGICAL) ×2
ELECTRODE REM PT RTRN 9FT ADLT (ELECTROSURGICAL) ×1 IMPLANT
GLOVE SURG ENC MOIS LTX SZ6.5 (GLOVE) ×2 IMPLANT
GLOVE SURG SIGNA 7.5 PF LTX (GLOVE) ×2 IMPLANT
GLOVE SURG UNDER POLY LF SZ7 (GLOVE) ×2 IMPLANT
GOWN STRL REUS W/ TWL LRG LVL3 (GOWN DISPOSABLE) ×1 IMPLANT
GOWN STRL REUS W/ TWL XL LVL3 (GOWN DISPOSABLE) ×1 IMPLANT
GOWN STRL REUS W/TWL LRG LVL3 (GOWN DISPOSABLE) ×2
GOWN STRL REUS W/TWL XL LVL3 (GOWN DISPOSABLE) ×2
KIT MARKER MARGIN INK (KITS) ×2 IMPLANT
NEEDLE HYPO 25X1 1.5 SAFETY (NEEDLE) ×2 IMPLANT
NS IRRIG 1000ML POUR BTL (IV SOLUTION) IMPLANT
PACK BASIN DAY SURGERY FS (CUSTOM PROCEDURE TRAY) ×2 IMPLANT
PENCIL SMOKE EVACUATOR (MISCELLANEOUS) ×2 IMPLANT
SLEEVE SCD COMPRESS KNEE MED (STOCKING) ×2 IMPLANT
SPONGE T-LAP 4X18 ~~LOC~~+RFID (SPONGE) ×2 IMPLANT
SUT MNCRL AB 4-0 PS2 18 (SUTURE) ×2 IMPLANT
SUT SILK 2 0 SH (SUTURE) IMPLANT
SUT VIC AB 3-0 SH 27 (SUTURE) ×2
SUT VIC AB 3-0 SH 27X BRD (SUTURE) ×1 IMPLANT
SYR CONTROL 10ML LL (SYRINGE) ×2 IMPLANT
TOWEL GREEN STERILE FF (TOWEL DISPOSABLE) ×2 IMPLANT
TRAY FAXITRON CT DISP (TRAY / TRAY PROCEDURE) ×2 IMPLANT
TUBE CONNECTING 20X1/4 (TUBING) IMPLANT
YANKAUER SUCT BULB TIP NO VENT (SUCTIONS) IMPLANT

## 2021-03-31 NOTE — Interval H&P Note (Signed)
History and Physical Interval Note: no change in H and P  03/31/2021 7:08 AM  Natasha Cobb  has presented today for surgery, with the diagnosis of RIGHT BREAST PAPILLOMA.  The various methods of treatment have been discussed with the patient and family. After consideration of risks, benefits and other options for treatment, the patient has consented to  Procedure(s): RIGHT BREAST LUMPECTOMY WITH RADIOACTIVE SEED LOCALIZATION (Right) as a surgical intervention.  The patient's history has been reviewed, patient examined, no change in status, stable for surgery.  I have reviewed the patient's chart and labs.  Questions were answered to the patient's satisfaction.     Coralie Keens

## 2021-03-31 NOTE — Discharge Instructions (Addendum)
[place] Office Phone Number [phone]  BREAST BIOPSY/ PARTIAL MASTECTOMY: POST OP INSTRUCTIONS  Always review your discharge instruction sheet given to you by the facility where your surgery was performed.  IF YOU HAVE DISABILITY OR FAMILY LEAVE FORMS, YOU MUST BRING THEM TO THE OFFICE FOR PROCESSING.  DO NOT GIVE THEM TO YOUR DOCTOR.  A prescription for pain medication may be given to you upon discharge.  Take your pain medication as prescribed, if needed.  If narcotic pain medicine is not needed, then you may take acetaminophen (Tylenol) or ibuprofen (Advil) as needed. Take your usually prescribed medications unless otherwise directed If you need a refill on your pain medication, please contact your pharmacy.  They will contact our office to request authorization.  Prescriptions will not be filled after 5pm or on week-ends. You should eat very light the first 24 hours after surgery, such as soup, crackers, pudding, etc.  Resume your normal diet the day after surgery. Most patients will experience some swelling and bruising in the breast.  Ice packs and a good support bra will help.  Swelling and bruising can take several days to resolve.  It is common to experience some constipation if taking pain medication after surgery.  Increasing fluid intake and taking a stool softener will usually help or prevent this problem from occurring.  A mild laxative (Milk of Magnesia or Miralax) should be taken according to package directions if there are no bowel movements after 48 hours. Unless discharge instructions indicate otherwise, you may remove your bandages 24-48 hours after surgery, and you may shower at that time.  You may have steri-strips (small skin tapes) in place directly over the incision.  These strips should be left on the skin for 7-10 days.  If your surgeon used skin glue on the incision, you may shower in 24 hours.  The glue will flake off over the next 2-3 weeks.  Any  sutures or staples will be removed at the office during your follow-up visit. ACTIVITIES:  You may resume regular daily activities (gradually increasing) beginning the next day.  Wearing a good support bra or sports bra minimizes pain and swelling.  You may have sexual intercourse when it is comfortable. You may drive when you no longer are taking prescription pain medication, you can comfortably wear a seatbelt, and you can safely maneuver your car and apply brakes. RETURN TO WORK:  ______________________________________________________________________________________ Dennis Bast should see your doctor in the office for a follow-up appointment approximately two weeks after your surgery.  Your doctor's nurse will typically make your follow-up appointment when she calls you with your pathology report.  Expect your pathology report 2-3 business days after your surgery.  You may call to check if you do not hear from [place] after three days. OTHER INSTRUCTIONS: OK TO SHOWER STARTING TOMORROW ICE PACK, TYLENOL, AND IBUPROFEN ALSO FOR PAIN _______________________________________________________________________________________________ _____________________________________________________________________________________________________________________________________ _____________________________________________________________________________________________________________________________________ _____________________________________________________________________________________________________________________________________  WHEN TO CALL YOUR DOCTOR: Fever over 101.0 Nausea and/or vomiting. Extreme swelling or bruising. Continued bleeding from incision. Increased pain, redness, or drainage from the incision.  The clinic staff is available to answer your questions during regular business hours.  Please don't hesitate to call and ask to speak to one of the nurses for clinical concerns.  If you have a medical  emergency, go to the nearest emergency room or call 911.  A surgeon from [facility] is always on call at the hospital.  For further questions, please visit [email or web address]    May have  Tylenol after 1:30pm today, if needed.   Post Anesthesia Home Care Instructions  Activity: Get plenty of rest for the remainder of the day. A responsible individual must stay with you for 24 hours following the procedure.  For the next 24 hours, DO NOT: -Drive a car -Paediatric nurse -Drink alcoholic beverages -Take any medication unless instructed by your physician -Make any legal decisions or sign important papers.  Meals: Start with liquid foods such as gelatin or soup. Progress to regular foods as tolerated. Avoid greasy, spicy, heavy foods. If nausea and/or vomiting occur, drink only clear liquids until the nausea and/or vomiting subsides. Call your physician if vomiting continues.  Special Instructions/Symptoms: Your throat may feel dry or sore from the anesthesia or the breathing tube placed in your throat during surgery. If this causes discomfort, gargle with warm salt water. The discomfort should disappear within 24 hours.  If you had a scopolamine patch placed behind your ear for the management of post- operative nausea and/or vomiting:  1. The medication in the patch is effective for 72 hours, after which it should be removed.  Wrap patch in a tissue and discard in the trash. Wash hands thoroughly with soap and water. 2. You may remove the patch earlier than 72 hours if you experience unpleasant side effects which may include dry mouth, dizziness or visual disturbances. 3. Avoid touching the patch. Wash your hands with soap and water after contact with the patch.

## 2021-03-31 NOTE — Anesthesia Postprocedure Evaluation (Signed)
Anesthesia Post Note  Patient: Natasha Cobb  Procedure(s) Performed: RIGHT BREAST LUMPECTOMY WITH RADIOACTIVE SEED LOCALIZATION (Right: Breast)     Patient location during evaluation: PACU Anesthesia Type: General Level of consciousness: awake and alert Pain management: pain level controlled Vital Signs Assessment: post-procedure vital signs reviewed and stable Respiratory status: spontaneous breathing, nonlabored ventilation and respiratory function stable Cardiovascular status: blood pressure returned to baseline and stable Postop Assessment: no apparent nausea or vomiting Anesthetic complications: no   No notable events documented.  Last Vitals:  Vitals:   03/31/21 0945 03/31/21 1008  BP: (!) 111/49 113/65  Pulse: 63 (!) 56  Resp: 16 17  Temp: (!) 36.3 C (!) 36.3 C  SpO2: 100% 100%    Last Pain:  Vitals:   03/31/21 1008  TempSrc:   PainSc: 0-No pain                 Lynda Rainwater

## 2021-03-31 NOTE — Anesthesia Procedure Notes (Signed)
Procedure Name: LMA Insertion Date/Time: 03/31/2021 8:33 AM Performed by: Maryella Shivers, CRNA Pre-anesthesia Checklist: Patient identified, Emergency Drugs available, Suction available and Patient being monitored Patient Re-evaluated:Patient Re-evaluated prior to induction Oxygen Delivery Method: Circle system utilized Preoxygenation: Pre-oxygenation with 100% oxygen Induction Type: IV induction Ventilation: Mask ventilation without difficulty LMA: LMA inserted LMA Size: 4.0 Number of attempts: 1 Airway Equipment and Method: Bite block Placement Confirmation: positive ETCO2 Tube secured with: Tape Dental Injury: Teeth and Oropharynx as per pre-operative assessment

## 2021-03-31 NOTE — Op Note (Signed)
RIGHT BREAST LUMPECTOMY WITH RADIOACTIVE SEED LOCALIZATION  Procedure Note  Natasha Cobb 03/31/2021   Pre-op Diagnosis: RIGHT BREAST PAPILLOMA     Post-op Diagnosis: same  Procedure(s): RIGHT BREAST LUMPECTOMY WITH RADIOACTIVE SEED LOCALIZATION  Surgeon(s): Coralie Keens, MD  Anesthesia: General  Staff:  Circulator: Izora Ribas, RN Scrub Person: Lorenza Burton, CST  Estimated Blood Loss: Minimal               Specimens: sent to path  Indications: This is a 70 year old female was found to have a small abnormality in the right breast on screening mammography.  She underwent a biopsy of this showing a sclerosing papilloma.  It was recommended to remove this area.  Procedure: The patient was brought to the operating room identifies correct patient.  She is placed upon the operating table general anesthesia was induced.  Her right breast was prepped and draped in usual sterile fashion.  Using neoprobe I located the radioactive seed in the far lateral lower quadrant of the right breast.  I anesthetized the skin over the area with Marcaine and made incision with a scalpel.  I then dissected down to the breast tissue with electrocautery.  With the neoprobe I was able to locate the radioactive seed in the performed a wide lumpectomy staying widely around the seed with the aid of the neoprobe.  Once the lumpectomy specimen was removed, I marked all the margins with paint.  An x-ray was performed confirming that the radioactive seed and previous biopsy clip were in the specimen.  The specimen was then sent to pathology for evaluation.  I achieved hemostasis with the cautery.  I anesthetized the incision further with Marcaine.  I then closed the subcutaneous tissue with interrupted 3-0 Vicryl sutures and closed the skin with a running 4-0 Monocryl.  Dermabond was then applied.  The patient tolerated the procedure well.  All the counts were correct at the end of the procedure.  The patient  was then extubated in the operating room and taken in a stable condition to the recovery room.          Coralie Keens   Date: 03/31/2021  Time: 9:02 AM

## 2021-03-31 NOTE — Transfer of Care (Signed)
Immediate Anesthesia Transfer of Care Note  Patient: Natasha Cobb  Procedure(s) Performed: RIGHT BREAST LUMPECTOMY WITH RADIOACTIVE SEED LOCALIZATION (Right: Breast)  Patient Location: PACU  Anesthesia Type:General  Level of Consciousness: sedated  Airway & Oxygen Therapy: Patient Spontanous Breathing and Patient connected to face mask oxygen  Post-op Assessment: Report given to RN and Post -op Vital signs reviewed and stable  Post vital signs: Reviewed and stable  Last Vitals:  Vitals Value Taken Time  BP 88/49 03/31/21 0910  Temp    Pulse 51 03/31/21 0910  Resp 11 03/31/21 0910  SpO2 100 % 03/31/21 0910  Vitals shown include unvalidated device data.  Last Pain:  Vitals:   03/31/21 0720  TempSrc: Oral  PainSc: 0-No pain         Complications: No notable events documented.

## 2021-03-31 NOTE — Addendum Note (Signed)
Addendum  created 03/31/21 1058 by Maryella Shivers, CRNA   Charge Capture section accepted

## 2021-03-31 NOTE — Anesthesia Preprocedure Evaluation (Addendum)
Anesthesia Evaluation  Patient identified by MRN, date of birth, ID band Patient awake    Reviewed: Allergy & Precautions, NPO status , Patient's Chart, lab work & pertinent test results  Airway Mallampati: II  TM Distance: >3 FB Neck ROM: Full    Dental no notable dental hx.    Pulmonary neg pulmonary ROS,    Pulmonary exam normal breath sounds clear to auscultation       Cardiovascular hypertension, Pt. on medications and Pt. on home beta blockers negative cardio ROS Normal cardiovascular exam Rhythm:Regular Rate:Normal     Neuro/Psych Dementia negative neurological ROS  negative psych ROS   GI/Hepatic negative GI ROS, Neg liver ROS,   Endo/Other  negative endocrine ROS  Renal/GU negative Renal ROS  negative genitourinary   Musculoskeletal negative musculoskeletal ROS (+)   Abdominal   Peds negative pediatric ROS (+)  Hematology negative hematology ROS (+)   Anesthesia Other Findings   Reproductive/Obstetrics negative OB ROS                            Anesthesia Physical Anesthesia Plan  ASA: 2  Anesthesia Plan: General   Post-op Pain Management:    Induction: Intravenous  PONV Risk Score and Plan: 3 and Ondansetron, Dexamethasone, Midazolam and Treatment may vary due to age or medical condition  Airway Management Planned: LMA  Additional Equipment:   Intra-op Plan:   Post-operative Plan: Extubation in OR  Informed Consent: I have reviewed the patients History and Physical, chart, labs and discussed the procedure including the risks, benefits and alternatives for the proposed anesthesia with the patient or authorized representative who has indicated his/her understanding and acceptance.     Dental advisory given  Plan Discussed with: CRNA  Anesthesia Plan Comments:         Anesthesia Quick Evaluation

## 2021-04-03 ENCOUNTER — Encounter (HOSPITAL_BASED_OUTPATIENT_CLINIC_OR_DEPARTMENT_OTHER): Payer: Self-pay | Admitting: Surgery

## 2021-04-12 ENCOUNTER — Encounter: Payer: Self-pay | Admitting: Neurology

## 2021-04-12 ENCOUNTER — Ambulatory Visit: Payer: Medicare HMO | Admitting: Neurology

## 2021-04-12 ENCOUNTER — Other Ambulatory Visit: Payer: Self-pay

## 2021-04-12 VITALS — BP 120/75 | HR 71 | Ht 64.0 in | Wt 128.6 lb

## 2021-04-12 DIAGNOSIS — G300 Alzheimer's disease with early onset: Secondary | ICD-10-CM | POA: Diagnosis not present

## 2021-04-12 DIAGNOSIS — F0280 Dementia in other diseases classified elsewhere without behavioral disturbance: Secondary | ICD-10-CM | POA: Diagnosis not present

## 2021-04-12 MED ORDER — DONEPEZIL HCL 10 MG PO TABS: ORAL_TABLET | ORAL | 11 refills | Status: DC
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
## 2021-04-12 NOTE — Patient Instructions (Signed)
Continue Donepezil 10mg  daily  2. Your husband is requested to start using a pillbox and help with your medications  3. Follow-up in 6 months, call for any changes   FALL PRECAUTIONS: Be cautious when walking. Scan the area for obstacles that may increase the risk of trips and falls. When getting up in the mornings, sit up at the edge of the bed for a few minutes before getting out of bed. Consider elevating the bed at the head end to avoid drop of blood pressure when getting up. Walk always in a well-lit room (use night lights in the walls). Avoid area rugs or power cords from appliances in the middle of the walkways. Use a walker or a cane if necessary and consider physical therapy for balance exercise. Get your eyesight checked regularly.  FINANCIAL OVERSIGHT: Supervision, especially oversight when making financial decisions or transactions is also recommended.  HOME SAFETY: Consider the safety of the kitchen when operating appliances like stoves, microwave oven, and blender. Consider having supervision and share cooking responsibilities until no longer able to participate in those. Accidents with firearms and other hazards in the house should be identified and addressed as well.  ABILITY TO BE LEFT ALONE: If patient is unable to contact 911 operator, consider using LifeLine, or when the need is there, arrange for someone to stay with patients. Smoking is a fire hazard, consider supervision or cessation. Risk of wandering should be assessed by caregiver and if detected at any point, supervision and safe proof recommendations should be instituted.  MEDICATION SUPERVISION: Inability to self-administer medication needs to be constantly addressed. Implement a mechanism to ensure safe administration of the medications.  RECOMMENDATIONS FOR ALL PATIENTS WITH MEMORY PROBLEMS: 1. Continue to exercise (Recommend 30 minutes of walking everyday, or 3 hours every week) 2. Increase social interactions -  continue going to [place] and enjoy social gatherings with friends and family 3. Eat healthy, avoid fried foods and eat more fruits and vegetables 4. Maintain adequate blood pressure, blood sugar, and blood cholesterol level. Reducing the risk of stroke and cardiovascular disease also helps promoting better memory. 5. Avoid stressful situations. Live a simple life and avoid aggravations. Organize your time and prepare for the next day in anticipation. 6. Sleep well, avoid any interruptions of sleep and avoid any distractions in the bedroom that may interfere with adequate sleep quality 7. Avoid sugar, avoid sweets as there is a strong link between excessive sugar intake, diabetes, and cognitive impairment The Mediterranean diet has been shown to help patients reduce the risk of progressive memory disorders and reduces cardiovascular risk. This includes eating fish, eat fruits and green leafy vegetables, nuts like almonds and hazelnuts, walnuts, and also use olive oil. Avoid fast foods and fried foods as much as possible. Avoid sweets and sugar as sugar use has been linked to worsening of memory function.  There is always a concern of gradual progression of memory problems. If this is the case, then we may need to adjust level of care according to patient needs. Support, both to the patient and caregiver, should then be put into place.

## 2021-04-14 LAB — SURGICAL PATHOLOGY

## 2021-04-19 ENCOUNTER — Other Ambulatory Visit: Payer: Self-pay | Admitting: Radiation Oncology

## 2021-04-19 ENCOUNTER — Inpatient Hospital Stay
Admission: RE | Admit: 2021-04-19 | Discharge: 2021-04-19 | Disposition: A | Discharge status: Home / Self Care | Payer: Self-pay | Source: Ambulatory Visit | Attending: Radiation Oncology | Admitting: Radiation Oncology

## 2021-04-19 DIAGNOSIS — D241 Benign neoplasm of right breast: Secondary | ICD-10-CM

## 2021-04-20 ENCOUNTER — Telehealth: Payer: Self-pay | Admitting: Hematology and Oncology

## 2021-04-20 NOTE — Telephone Encounter (Signed)
Received a new referral from Dr. Ninfa Linden for brca. Ms. Peery has been scheduled to see Dr. Chryl Heck on 8/1 at 1040am.

## 2021-04-27 DIAGNOSIS — D0511 Intraductal carcinoma in situ of right breast: Secondary | ICD-10-CM | POA: Insufficient documentation

## 2021-04-27 NOTE — Progress Notes (Signed)
Location of Breast Cancer:  right UOQ & LOQ  Histology per Pathology Report:  12/29/2020  03/31/2021  Receptor Status:    Did patient present with symptoms (if so, please note symptoms) or was this found on screening mammography?:  small abnormality in the right breast on screening mammography  Past/Anticipated interventions by surgeon, if any: 03/31/2021 Dr Ninfa Linden radioactive seed guided right breast lumpectomy  Past/Anticipated interventions by medical oncology, if any: Appointment   Lymphedema issues, if any:  no    Pain issues, if any:  no   SAFETY ISSUES: Prior radiation? no Pacemaker/ICD? no Possible current pregnancy?no, post menopausal Is the patient on methotrexate? no  Current Complaints / other details:  none     Vitals:   05/02/21 0745  BP: 123/83  Pulse: 76  Resp: (!) 22  Temp: 98.4 F (36.9 C)  SpO2: 100%  Weight: 118 lb 6.4 oz (53.7 kg)  Height: '5\' 4"'$  (1.626 m)

## 2021-04-29 NOTE — Progress Notes (Signed)
Radiation Oncology         [phone] ________________________________  Initial Outpatient Consultation  Name: Natasha Cobb MRN: [id number]  Date: 05/02/2021  DOB: 1951/08/30  CC:Gaynelle Arabian, MD  Coralie Keens, MD   REFERRING PHYSICIAN: Coralie Keens, MD  DIAGNOSIS: The encounter diagnosis was Ductal carcinoma in situ of right breast.  Right Breast LOQ, Low-Grade Ductal Carcinoma in situ, ER+ / PR+    HISTORY OF PRESENT ILLNESS::Natasha Cobb is a 70 y.o. female who is seen as a courtesy of Dr. Ninfa Linden for an opinion concerning radiation therapy as part of management for her recently diagnosed right breast DCIS.  She is accompanied by her husband on evaluation today  Screening mammogram demonstrated a small abnormality in the right breast (date unknown). The patient was then referred to Dr. Luan Pulling on 12/14/20 for evaluation.  Right breast diagnostic mammogram performed at [facility] on 12/14/20 revealed: 0.5 cm grouped calcifications increasing in number in the right breast at 9 o'clock; middle depth.  The patient then underwent right breast biopsy at the 9 o'clock position 9 cmfn on 12/29/20 revealing: sclerotic ductal papilloma with UDH and calcifications.  The patient was then referred to Dr. Ninfa Linden at [facility] on 03/30/21 following these findings to discuss surgical course.Upon physical examination, no breast masses were palpable, and no evidence of axillary adenopathy was found.  The patient soon after underwent right breast lumpectomy under Dr. Ninfa Linden on 03/31/21 showing: low-grade ductal carcinoma in situ with calcifications measuring 0.5 cm; margins not involved. Prognostic indicators significant for estrogen receptor: 90%, positive, progesterone receptor: 95%, positive, both with strong staining intensity.  Closest margin on the DCIS was inferior at 8 mm.  PREVIOUS RADIATION THERAPY: No  PAST MEDICAL HISTORY:  Past Medical History:   Diagnosis Date   Concussion 02/27/2018   Dementia ([place])    Hyperlipidemia    Hypertension     PAST SURGICAL HISTORY: Past Surgical History:  Procedure Laterality Date   BREAST LUMPECTOMY WITH RADIOACTIVE SEED LOCALIZATION Right 03/31/2021   Procedure: RIGHT BREAST LUMPECTOMY WITH RADIOACTIVE SEED LOCALIZATION;  Surgeon: Coralie Keens, MD;  Location: [place];  Service: General;  Laterality: Right;   NO PAST SURGERIES      FAMILY HISTORY:  Family History  Problem Relation Age of Onset   Hypertension Other    Dementia Neg Hx     SOCIAL HISTORY:  Social History   Tobacco Use   Smoking status: Never   Smokeless tobacco: Never  Substance Use Topics   Alcohol use: No   Drug use: Never    ALLERGIES: No Known Allergies  MEDICATIONS:  Current Outpatient Medications  Medication Sig Dispense Refill   amLODipine (NORVASC) 10 MG tablet Take 10 mg by mouth daily.     atorvastatin (LIPITOR) 40 MG tablet Take 40 mg by mouth daily.     donepezil (ARICEPT) 10 MG tablet Take 1 tablet daily 30 tablet 11   metoprolol succinate (TOPROL-XL) 100 MG 24 hr tablet Take 100 mg by mouth daily.     traMADol (ULTRAM) 50 MG tablet Take 1 tablet (50 mg total) by mouth every 6 (six) hours as needed for moderate pain. 20 tablet 0   No current facility-administered medications for this encounter.    REVIEW OF SYSTEMS:  A 10+ POINT REVIEW OF SYSTEMS WAS OBTAINED including neurology, dermatology, psychiatry, cardiac, respiratory, lymph, extremities, GI, GU, musculoskeletal, constitutional, reproductive, HEENT.  She denies any pain within the breast area nipple discharge or bleeding.  She denies any swelling in her right arm or hand.   PHYSICAL EXAM:  height is '5\' 4"'$  (1.626 m) and weight is 118 lb 6.4 oz (53.7 kg). Her temperature is 98.4 F (36.9 C). Her blood pressure is 123/83 and her pulse is 76. Her respiration is 22 (abnormal) and oxygen saturation is 100%.   General: Alert  and oriented, in no acute distress HEENT: Head is normocephalic. Extraocular movements are intact.  Neck: Neck is supple, no palpable cervical or supraclavicular lymphadenopathy. Heart: Regular in rate and rhythm with no murmurs, rubs, or gallops. Chest: Clear to auscultation bilaterally, with no rhonchi, wheezes, or rales. Abdomen: Soft, nontender, nondistended, with no rigidity or guarding. Extremities: No cyanosis or edema. Lymphatics: see Neck Exam Skin: No concerning lesions. Musculoskeletal: symmetric strength and muscle tone throughout. Neurologic: Cranial nerves II through XII are grossly intact. No obvious focalities. Speech is fluent. Coordination is intact. Psychiatric: Judgment and insight are intact. Affect is appropriate. Left breast: no palpable mass, nipple discharge or bleeding.  Right breast reveals well-healing lumpectomy scar without signs of infection or drainage.  No dominant mass appreciated in the breast.  ECOG = 1  0 - Asymptomatic (Fully active, able to carry on all predisease activities without restriction)  1 - Symptomatic but completely ambulatory (Restricted in physically strenuous activity but ambulatory and able to carry out work of a light or sedentary nature. For example, light housework, office work)  2 - Symptomatic, <50% in bed during the day (Ambulatory and capable of all self care but unable to carry out any work activities. Up and about more than 50% of waking hours)  3 - Symptomatic, >50% in bed, but not bedbound (Capable of only limited self-care, confined to bed or chair 50% or more of waking hours)  4 - Bedbound (Completely disabled. Cannot carry on any self-care. Totally confined to bed or chair)  5 - Death   Eustace Pen MM, Creech RH, Tormey DC, et al. [phone]). "Toxicity and response criteria of the [facility]". [place] Oncol. 5 (6): 649-55  LABORATORY DATA:  No results found for: WBC, HGB, HCT, MCV, PLT, NEUTROABS No  results found for: NA, K, CL, CO2, GLUCOSE, CREATININE, CALCIUM    RADIOGRAPHY: No results found.    IMPRESSION: Right Breast LOQ, Low-Grade Ductal Carcinoma in situ, ER+ / PR+   She would be a good candidate for adjuvant radiation therapy.  Her overall prognosis is excellent given her situation.  Radiation therapy will reduce the chances of recurrence within the breast but has no impact on overall survival.  I discussed the general course of radiation therapy with anticipated benefits and toxicities.  Patient has some memory issues but does wish to proceed with adjuvant radiation therapy.  Her husband who is present is in agreement.    We discussed the natural history of noninvasive breast cancer and general treatment, highlighting the role of radiotherapy in the management.  We discussed the available radiation techniques, and focused on the details of logistics and delivery.  We reviewed the anticipated acute and late sequelae associated with radiation in this setting.  The patient was encouraged to ask questions that I answered to the best of my ability.  A patient consent form was discussed and signed.  We retained a copy for our records.  The patient would like to proceed with radiation and will be scheduled for CT simulation.  PLAN: She will be scheduled for CT simulation in the near future.  Treatments to begin after adequate healing.  She would appear to be a good candidate for hypofractionated accelerated radiation therapy.  Anticipate 4 weeks of adjuvant radiation therapy.   60 minutes of total time was spent for this patient encounter, including preparation, face-to-face counseling with the patient and coordination of care, physical exam, and documentation of the encounter.   ------------------------------------------------  Blair Promise, PhD, MD  This document serves as a record of services personally performed by Gery Pray, MD. It was created on his behalf by Roney Mans,  a trained medical scribe. The creation of this record is based on the scribe's personal observations and the provider's statements to them. This document has been checked and approved by the attending provider.

## 2021-05-02 ENCOUNTER — Ambulatory Visit
Admission: RE | Admit: 2021-05-02 | Discharge: 2021-05-02 | Disposition: A | Discharge status: Home / Self Care | Payer: Medicare HMO | Source: Ambulatory Visit | Attending: Radiation Oncology | Admitting: Radiation Oncology

## 2021-05-02 ENCOUNTER — Other Ambulatory Visit: Payer: Self-pay

## 2021-05-02 ENCOUNTER — Inpatient Hospital Stay: Payer: Medicare HMO

## 2021-05-02 ENCOUNTER — Encounter: Payer: Self-pay | Admitting: Radiation Oncology

## 2021-05-02 ENCOUNTER — Inpatient Hospital Stay: Payer: Medicare HMO | Attending: Hematology and Oncology | Admitting: Hematology and Oncology

## 2021-05-02 ENCOUNTER — Encounter: Payer: Self-pay | Admitting: *Deleted

## 2021-05-02 ENCOUNTER — Telehealth: Payer: Self-pay | Admitting: Hematology and Oncology

## 2021-05-02 ENCOUNTER — Encounter: Payer: Self-pay | Admitting: Hematology and Oncology

## 2021-05-02 VITALS — BP 123/83 | HR 76 | Temp 98.4°F | Resp 22 | Ht 64.0 in | Wt 118.4 lb

## 2021-05-02 VITALS — BP 141/76 | HR 54 | Temp 97.3°F | Resp 17 | Wt 118.8 lb

## 2021-05-02 DIAGNOSIS — D0511 Intraductal carcinoma in situ of right breast: Secondary | ICD-10-CM

## 2021-05-02 DIAGNOSIS — R634 Abnormal weight loss: Secondary | ICD-10-CM | POA: Diagnosis not present

## 2021-05-02 DIAGNOSIS — E785 Hyperlipidemia, unspecified: Secondary | ICD-10-CM | POA: Diagnosis not present

## 2021-05-02 DIAGNOSIS — Z79899 Other long term (current) drug therapy: Secondary | ICD-10-CM | POA: Insufficient documentation

## 2021-05-02 DIAGNOSIS — F0390 Unspecified dementia without behavioral disturbance: Secondary | ICD-10-CM | POA: Insufficient documentation

## 2021-05-02 DIAGNOSIS — Z170 Estrogen receptor positive status [ER+]: Secondary | ICD-10-CM | POA: Insufficient documentation

## 2021-05-02 DIAGNOSIS — I10 Essential (primary) hypertension: Secondary | ICD-10-CM | POA: Insufficient documentation

## 2021-05-02 DIAGNOSIS — Z923 Personal history of irradiation: Secondary | ICD-10-CM | POA: Diagnosis not present

## 2021-05-02 DIAGNOSIS — Z8249 Family history of ischemic heart disease and other diseases of the circulatory system: Secondary | ICD-10-CM | POA: Insufficient documentation

## 2021-05-02 NOTE — Progress Notes (Signed)
See MD note for nursing evaluation. °

## 2021-05-02 NOTE — Patient Instructions (Signed)
Thank you for choosing [facility] to provide your oncology and hematology care.  To afford each patient quality time with our providers, please arrive 30 minutes before your scheduled appointment time.  If you arrive late for your appointment, you may be asked to reschedule.  We strive to give you quality time with our providers, and arriving late affects you and other patients whose appointments are after yours.   If you are a no show for multiple scheduled visits, you may be dismissed from the clinic at the providers discretion.    Again, thank you for choosing [facility], our hope is that these requests will decrease the amount of time that you wait before being seen by our physicians.  ______________________________________________________________________  Should you have questions after your visit to the [facility], please contact our office at [phone] between the hours of 8:30 and 4:30 p.m.    Voicemails left after 4:30p.m will not be returned until the following business day.    For prescription refill requests, please have your pharmacy contact us directly.  Please also try to allow 48 hours for prescription requests.    Please contact the scheduling department for questions regarding scheduling.  For scheduling of procedures such as PET scans, CT scans, MRI, Ultrasound, etc please contact central scheduling at [phone].    Resources For Cancer Patients and Caregivers:   Oncolink.org:  A wonderful resource for patients and healthcare providers for information regarding your disease, ways to tract your treatment, what to expect, etc.     [place]:  831-257-9920  Can help patients locate various types of support and financial assistance  Cancer Care: 1-800-813-HOPE 812-472-4370) Provides financial assistance, online support groups, medication/co-pay assistance.    [place]:  312-101-0473 Where to apply for food  stamps, Medicaid, and utility assistance  Medicare Rights Center: 435 135 1142 Helps people with Medicare understand their rights and benefits, navigate the Medicare system, and secure the quality healthcare they deserve  [place]: [place] [employer]'s shared-ride transportation service for eligible riders who have a disability that prevents them from riding the fixed route bus.    For additional information on assistance programs please contact our social worker:

## 2021-05-02 NOTE — Progress Notes (Signed)
[place] CONSULT NOTE  Patient Care Team: Gaynelle Arabian, MD as PCP - General (Family Medicine) Cameron Sprang, MD as Consulting Physician (Neurology)  CHIEF COMPLAINTS/PURPOSE OF CONSULTATION:  DCIS right breast  ASSESSMENT & PLAN:  This is a very pleasant 70 year old female patient with past medical history significant for hypertension, dyslipidemia, dementia/memory loss who was recently diagnosed to have right breast low-grade DCIS measuring 1.5 cm status post right breast lumpectomy who is here for follow-up and recommendations.  Pathology review: I discussed with the patient the difference between DCIS and invasive breast cancer. It is considered a precancerous lesion. DCIS is classified as a Stage 0 breast cancer. It is generally detected through mammograms as calcifications. We discussed the significance of grades and its impact on prognosis. We also discussed the importance of ER and PR receptors and their implications to adjuvant treatment options.  It is anticipated that if not treated, 20-30% of DCIS can develop into invasive breast cancer.  Recommendation: 1. Breast conserving surgery 2. Followed by adjuvant radiation therapy 3. Followed by antiestrogen therapy with tamoxifen/aromatase inhibitors based on menopausal status 5 years  Tamoxifen counseling: We discussed the risks and benefits of tamoxifen. These include but not limited to insomnia, hot flashes, mood changes, vaginal dryness, and weight gain. Although rare, serious side effects including endometrial cancer, risk of blood clots were also discussed. We strongly believe that the benefits far outweigh the risks. Patient understands these risks and consented to starting treatment. Planned treatment duration is 5 years.  Aromatase inhibitors counseling: We have discussed the mechanism of action of aromatase inhibitors today.  We have discussed adverse effects including but not limited to menopausal  symptoms, increased risk of osteoporosis and fractures, cardiovascular events, arthralgias and myalgias.  We do believe that the benefits far outweigh the risks.  Plan treatment duration of 5 years.  We will mostly proceed with aromatase inhibitors in this patient after completion of radiation.  HISTORY OF PRESENTING ILLNESS:   Natasha Cobb 70 y.o. female is here because of DCIS right breast.  Right breast diagnostic mammogram done on December 14, 2020 showed a 0.5 cm group of round calcifications in the right breast at 930, 9 cm from the nipple.  There is a second stable group of 5 round calcifications in the right breast upper outer aspect middle depth.  The 0.5 cm grouped calcifications in the right breast at 9:00 middle depth are increasing in number and stereotactic biopsy is recommended to exclude high risk lesion or DCIS.  Right medial breast core biopsy on December 29, 2020, 9:00, 9 cm from nipple showed sclerotic ductal papilloma with usual ductal epithelial hyperplasia and calcifications.  She had right breast lumpectomy by Dr. Ninfa Linden on April 06, 2021.  Right breast lumpectomy showed ductal carcinoma in situ with calcifications, margins not involved, measuring 1.5 cm, low-grade, all margins negative, prognostic showed ER +90%, strong staining, PR positive, 95%, strong staining.  She also has a follow-up with Dr. Sondra Come from [place] today. She has just followed up with Dr. Sondra Come, she could not remember the recommendations exactly.  Husband said that Dr. Sondra Come had recommended radiation followed by antiestrogen therapy but does not know about the exact details of the radiation. It also appears that her memory has been quite a bit of a problem lately, she is not necessarily diagnosed with any particular dementia however it appears that she has been following up with her doctor for memory loss, had an MRI brain. Besides  memory loss, they also complain of progressive weight loss over the past 2  or 3 years.  Husband says she has not been eating very well but patient says she eats okay and she also eats a lot of sweets.  She otherwise feels very good, denies any hematochezia or melena. She has been healing well from her surgery.  Rest of the pertinent 10 point ROS reviewed and negative.  MEDICAL HISTORY:  Past Medical History:  Diagnosis Date   Concussion 02/27/2018   Dementia ([place])    Hyperlipidemia    Hypertension     SURGICAL HISTORY: Past Surgical History:  Procedure Laterality Date   BREAST LUMPECTOMY WITH RADIOACTIVE SEED LOCALIZATION Right 03/31/2021   Procedure: RIGHT BREAST LUMPECTOMY WITH RADIOACTIVE SEED LOCALIZATION;  Surgeon: Coralie Keens, MD;  Location: [place];  Service: General;  Laterality: Right;   NO PAST SURGERIES      SOCIAL HISTORY: Social History   Socioeconomic History   Marital status: Married    Spouse name: Not on file   Number of children: Not on file   Years of education: 12   Highest education level: High school graduate  Occupational History   Occupation: Retired  Tobacco Use   Smoking status: Never   Smokeless tobacco: Never  Scientific laboratory technician Use: Not on file  Substance and Sexual Activity   Alcohol use: No   Drug use: Never   Sexual activity: Not Currently    Partners: Male    Birth control/protection: Post-menopausal  Other Topics Concern   Not on file  Social History Narrative   Lives with husband and two dogs      Right handed      [place] home      Highest level of edu- 12th grade   Social Determinants of Health   Financial Resource Strain: Not on file  Food Insecurity: Not on file  Transportation Needs: Not on file  Physical Activity: Not on file  Stress: Not on file  Social Connections: Not on file  Intimate Partner Violence: Not on file    FAMILY HISTORY: Family History  Problem Relation Age of Onset   Hypertension Other    Dementia Neg Hx     ALLERGIES:  has No Known  Allergies.  MEDICATIONS:  Current Outpatient Medications  Medication Sig Dispense Refill   amLODipine (NORVASC) 10 MG tablet Take 10 mg by mouth daily.     atorvastatin (LIPITOR) 40 MG tablet Take 40 mg by mouth daily.     donepezil (ARICEPT) 10 MG tablet Take 1 tablet daily 30 tablet 11   metoprolol succinate (TOPROL-XL) 100 MG 24 hr tablet Take 100 mg by mouth daily.     traMADol (ULTRAM) 50 MG tablet Take 1 tablet (50 mg total) by mouth every 6 (six) hours as needed for moderate pain. 20 tablet 0   No current facility-administered medications for this visit.     PHYSICAL EXAMINATION: ECOG PERFORMANCE STATUS: 0 - Asymptomatic  Vitals:   05/02/21 1053  BP: (!) 141/76  Pulse: (!) 54  Resp: 17  Temp: (!) 97.3 F (36.3 C)  SpO2: 100%   Filed Weights   05/02/21 1053  Weight: 118 lb 12.8 oz (53.9 kg)    Physical exam deferred today, she just had a breast exam with radiation oncology team.  She appears well and in no acute distress.  LABORATORY DATA:  I have reviewed the data as listed No results found for: WBC, HGB,  HCT, MCV, PLT   Chemistry   No results found for: NA, K, CL, CO2, BUN, CREATININE, GLU No results found for: CALCIUM, ALKPHOS, AST, ALT, BILITOT     RADIOGRAPHIC STUDIES: I have personally reviewed the radiological images as listed and agreed with the findings in the report. No results found.  All questions were answered. The patient knows to call the clinic with any problems, questions or concerns. I spent 45 minutes in the care of this patient including H and P, review of records, counseling and coordination of care. Discussed the above-mentioned details as in the assessment and plan including pathogenesis of DCIS, role of antiestrogen therapy, options for antiestrogen therapy and follow-up recommendations.    Benay Pike, MD 05/02/2021 11:23 AM

## 2021-05-02 NOTE — Telephone Encounter (Signed)
Scheduled per los. Gave avs and calendar  

## 2021-05-03 ENCOUNTER — Other Ambulatory Visit: Payer: Self-pay | Admitting: Hematology and Oncology

## 2021-05-03 DIAGNOSIS — D0511 Intraductal carcinoma in situ of right breast: Secondary | ICD-10-CM

## 2021-05-06 ENCOUNTER — Telehealth: Payer: Self-pay

## 2021-05-06 NOTE — Telephone Encounter (Signed)
Called and spoke with patient and her husband. Informed them that [facility]. Verbalized understanding.

## 2021-05-09 ENCOUNTER — Encounter: Payer: Self-pay | Admitting: *Deleted

## 2021-05-16 ENCOUNTER — Other Ambulatory Visit: Payer: Self-pay

## 2021-05-16 ENCOUNTER — Ambulatory Visit
Admission: RE | Admit: 2021-05-16 | Discharge: 2021-05-16 | Disposition: A | Discharge status: Home / Self Care | Payer: Medicare HMO | Source: Ambulatory Visit | Attending: Radiation Oncology | Admitting: Radiation Oncology

## 2021-05-16 DIAGNOSIS — Z510 Encounter for antineoplastic radiation therapy: Secondary | ICD-10-CM | POA: Diagnosis not present

## 2021-05-16 DIAGNOSIS — D0511 Intraductal carcinoma in situ of right breast: Secondary | ICD-10-CM | POA: Diagnosis not present

## 2021-05-22 DIAGNOSIS — D0511 Intraductal carcinoma in situ of right breast: Secondary | ICD-10-CM | POA: Diagnosis not present

## 2021-05-22 DIAGNOSIS — Z510 Encounter for antineoplastic radiation therapy: Secondary | ICD-10-CM | POA: Diagnosis not present

## 2021-05-23 ENCOUNTER — Ambulatory Visit
Admission: RE | Admit: 2021-05-23 | Discharge: 2021-05-23 | Disposition: A | Discharge status: Home / Self Care | Payer: Medicare HMO | Source: Ambulatory Visit | Attending: Radiation Oncology | Admitting: Radiation Oncology

## 2021-05-23 DIAGNOSIS — D0511 Intraductal carcinoma in situ of right breast: Secondary | ICD-10-CM

## 2021-05-23 DIAGNOSIS — Z510 Encounter for antineoplastic radiation therapy: Secondary | ICD-10-CM | POA: Diagnosis not present

## 2021-05-24 ENCOUNTER — Other Ambulatory Visit: Payer: Self-pay

## 2021-05-24 ENCOUNTER — Ambulatory Visit
Admission: RE | Admit: 2021-05-24 | Discharge: 2021-05-24 | Disposition: A | Discharge status: Home / Self Care | Payer: Medicare HMO | Source: Ambulatory Visit | Attending: Radiation Oncology | Admitting: Radiation Oncology

## 2021-05-24 DIAGNOSIS — Z510 Encounter for antineoplastic radiation therapy: Secondary | ICD-10-CM | POA: Diagnosis not present

## 2021-05-24 DIAGNOSIS — D0511 Intraductal carcinoma in situ of right breast: Secondary | ICD-10-CM | POA: Diagnosis not present

## 2021-05-24 MED ORDER — ALRA NON-METALLIC DEODORANT (RAD-ONC)
1.0000 "application " | Freq: Once | TOPICAL | Status: AC
  Administered 2021-05-24: 1 via TOPICAL

## 2021-05-24 MED ORDER — RADIAPLEXRX EX GEL: Freq: Once | CUTANEOUS | Status: AC

## 2021-05-24 NOTE — Progress Notes (Signed)

## 2021-05-25 ENCOUNTER — Other Ambulatory Visit: Payer: Self-pay

## 2021-05-25 ENCOUNTER — Ambulatory Visit
Admission: RE | Admit: 2021-05-25 | Discharge: 2021-05-25 | Disposition: A | Discharge status: Home / Self Care | Payer: Medicare HMO | Source: Ambulatory Visit | Attending: Radiation Oncology | Admitting: Radiation Oncology

## 2021-05-25 DIAGNOSIS — Z510 Encounter for antineoplastic radiation therapy: Secondary | ICD-10-CM | POA: Diagnosis not present

## 2021-05-25 DIAGNOSIS — D0511 Intraductal carcinoma in situ of right breast: Secondary | ICD-10-CM | POA: Diagnosis not present

## 2021-05-26 ENCOUNTER — Ambulatory Visit
Admission: RE | Admit: 2021-05-26 | Discharge: 2021-05-26 | Disposition: A | Discharge status: Home / Self Care | Payer: Medicare HMO | Source: Ambulatory Visit | Attending: Radiation Oncology | Admitting: Radiation Oncology

## 2021-05-26 DIAGNOSIS — Z510 Encounter for antineoplastic radiation therapy: Secondary | ICD-10-CM | POA: Diagnosis not present

## 2021-05-26 DIAGNOSIS — D0511 Intraductal carcinoma in situ of right breast: Secondary | ICD-10-CM | POA: Diagnosis not present

## 2021-05-27 ENCOUNTER — Other Ambulatory Visit: Payer: Self-pay

## 2021-05-27 ENCOUNTER — Ambulatory Visit
Admission: RE | Admit: 2021-05-27 | Discharge: 2021-05-27 | Disposition: A | Discharge status: Home / Self Care | Payer: Medicare HMO | Source: Ambulatory Visit | Attending: Radiation Oncology | Admitting: Radiation Oncology

## 2021-05-27 DIAGNOSIS — Z510 Encounter for antineoplastic radiation therapy: Secondary | ICD-10-CM | POA: Diagnosis not present

## 2021-05-27 DIAGNOSIS — D0511 Intraductal carcinoma in situ of right breast: Secondary | ICD-10-CM | POA: Diagnosis not present

## 2021-05-30 ENCOUNTER — Ambulatory Visit
Admission: RE | Admit: 2021-05-30 | Discharge: 2021-05-30 | Disposition: A | Discharge status: Home / Self Care | Payer: Medicare HMO | Source: Ambulatory Visit | Attending: Radiation Oncology | Admitting: Radiation Oncology

## 2021-05-30 ENCOUNTER — Other Ambulatory Visit: Payer: Self-pay

## 2021-05-30 DIAGNOSIS — D0511 Intraductal carcinoma in situ of right breast: Secondary | ICD-10-CM | POA: Diagnosis not present

## 2021-05-30 DIAGNOSIS — Z510 Encounter for antineoplastic radiation therapy: Secondary | ICD-10-CM | POA: Diagnosis not present

## 2021-05-31 ENCOUNTER — Ambulatory Visit
Admission: RE | Admit: 2021-05-31 | Discharge: 2021-05-31 | Disposition: A | Discharge status: Home / Self Care | Payer: Medicare HMO | Source: Ambulatory Visit | Attending: Radiation Oncology | Admitting: Radiation Oncology

## 2021-05-31 DIAGNOSIS — D0511 Intraductal carcinoma in situ of right breast: Secondary | ICD-10-CM | POA: Diagnosis not present

## 2021-05-31 DIAGNOSIS — Z510 Encounter for antineoplastic radiation therapy: Secondary | ICD-10-CM | POA: Diagnosis not present

## 2021-06-01 ENCOUNTER — Ambulatory Visit
Admission: RE | Admit: 2021-06-01 | Discharge: 2021-06-01 | Disposition: A | Discharge status: Home / Self Care | Payer: Medicare HMO | Source: Ambulatory Visit | Attending: Radiation Oncology | Admitting: Radiation Oncology

## 2021-06-01 ENCOUNTER — Ambulatory Visit
Admission: RE | Admit: 2021-06-01 | Discharge: 2021-06-01 | Disposition: A | Discharge status: Home / Self Care | Payer: Medicare HMO | Source: Ambulatory Visit | Attending: Hematology and Oncology | Admitting: Hematology and Oncology

## 2021-06-01 ENCOUNTER — Other Ambulatory Visit: Payer: Self-pay

## 2021-06-01 DIAGNOSIS — Z780 Asymptomatic menopausal state: Secondary | ICD-10-CM | POA: Diagnosis not present

## 2021-06-01 DIAGNOSIS — M8589 Other specified disorders of bone density and structure, multiple sites: Secondary | ICD-10-CM | POA: Diagnosis not present

## 2021-06-01 DIAGNOSIS — Z510 Encounter for antineoplastic radiation therapy: Secondary | ICD-10-CM | POA: Diagnosis not present

## 2021-06-01 DIAGNOSIS — D0511 Intraductal carcinoma in situ of right breast: Secondary | ICD-10-CM

## 2021-06-02 ENCOUNTER — Ambulatory Visit
Admission: RE | Admit: 2021-06-02 | Discharge: 2021-06-02 | Disposition: A | Discharge status: Home / Self Care | Payer: Medicare HMO | Source: Ambulatory Visit | Attending: Radiation Oncology | Admitting: Radiation Oncology

## 2021-06-02 DIAGNOSIS — Z510 Encounter for antineoplastic radiation therapy: Secondary | ICD-10-CM | POA: Insufficient documentation

## 2021-06-02 DIAGNOSIS — D0511 Intraductal carcinoma in situ of right breast: Secondary | ICD-10-CM | POA: Diagnosis not present

## 2021-06-03 ENCOUNTER — Other Ambulatory Visit: Payer: Self-pay

## 2021-06-03 ENCOUNTER — Ambulatory Visit
Admission: RE | Admit: 2021-06-03 | Discharge: 2021-06-03 | Disposition: A | Discharge status: Home / Self Care | Payer: Medicare HMO | Source: Ambulatory Visit | Attending: Radiation Oncology | Admitting: Radiation Oncology

## 2021-06-03 DIAGNOSIS — D0511 Intraductal carcinoma in situ of right breast: Secondary | ICD-10-CM | POA: Diagnosis not present

## 2021-06-03 DIAGNOSIS — Z510 Encounter for antineoplastic radiation therapy: Secondary | ICD-10-CM | POA: Diagnosis not present

## 2021-06-07 ENCOUNTER — Ambulatory Visit
Admission: RE | Admit: 2021-06-07 | Discharge: 2021-06-07 | Disposition: A | Discharge status: Home / Self Care | Payer: Medicare HMO | Source: Ambulatory Visit | Attending: Radiation Oncology | Admitting: Radiation Oncology

## 2021-06-07 ENCOUNTER — Other Ambulatory Visit: Payer: Self-pay

## 2021-06-07 ENCOUNTER — Ambulatory Visit: Payer: Medicare HMO | Admitting: Radiation Oncology

## 2021-06-07 DIAGNOSIS — Z510 Encounter for antineoplastic radiation therapy: Secondary | ICD-10-CM | POA: Diagnosis not present

## 2021-06-07 DIAGNOSIS — D0511 Intraductal carcinoma in situ of right breast: Secondary | ICD-10-CM | POA: Diagnosis not present

## 2021-06-08 ENCOUNTER — Ambulatory Visit: Payer: Medicare HMO | Admitting: Radiation Oncology

## 2021-06-08 ENCOUNTER — Ambulatory Visit
Admission: RE | Admit: 2021-06-08 | Discharge: 2021-06-08 | Disposition: A | Discharge status: Home / Self Care | Payer: Medicare HMO | Source: Ambulatory Visit | Attending: Radiation Oncology | Admitting: Radiation Oncology

## 2021-06-08 DIAGNOSIS — Z510 Encounter for antineoplastic radiation therapy: Secondary | ICD-10-CM | POA: Diagnosis not present

## 2021-06-08 DIAGNOSIS — D0511 Intraductal carcinoma in situ of right breast: Secondary | ICD-10-CM | POA: Diagnosis not present

## 2021-06-09 ENCOUNTER — Ambulatory Visit: Payer: Medicare HMO | Admitting: Radiation Oncology

## 2021-06-09 ENCOUNTER — Ambulatory Visit
Admission: RE | Admit: 2021-06-09 | Discharge: 2021-06-09 | Disposition: A | Discharge status: Home / Self Care | Payer: Medicare HMO | Source: Ambulatory Visit | Attending: Radiation Oncology | Admitting: Radiation Oncology

## 2021-06-09 ENCOUNTER — Other Ambulatory Visit: Payer: Self-pay

## 2021-06-09 DIAGNOSIS — D0511 Intraductal carcinoma in situ of right breast: Secondary | ICD-10-CM | POA: Diagnosis not present

## 2021-06-09 DIAGNOSIS — Z510 Encounter for antineoplastic radiation therapy: Secondary | ICD-10-CM | POA: Diagnosis not present

## 2021-06-10 ENCOUNTER — Ambulatory Visit
Admission: RE | Admit: 2021-06-10 | Discharge: 2021-06-10 | Disposition: A | Discharge status: Home / Self Care | Payer: Medicare HMO | Source: Ambulatory Visit | Attending: Radiation Oncology | Admitting: Radiation Oncology

## 2021-06-10 DIAGNOSIS — D0511 Intraductal carcinoma in situ of right breast: Secondary | ICD-10-CM | POA: Diagnosis not present

## 2021-06-10 DIAGNOSIS — Z510 Encounter for antineoplastic radiation therapy: Secondary | ICD-10-CM | POA: Diagnosis not present

## 2021-06-13 ENCOUNTER — Other Ambulatory Visit: Payer: Self-pay

## 2021-06-13 ENCOUNTER — Ambulatory Visit
Admission: RE | Admit: 2021-06-13 | Discharge: 2021-06-13 | Disposition: A | Discharge status: Home / Self Care | Payer: Medicare HMO | Source: Ambulatory Visit | Attending: Radiation Oncology | Admitting: Radiation Oncology

## 2021-06-13 DIAGNOSIS — D0511 Intraductal carcinoma in situ of right breast: Secondary | ICD-10-CM | POA: Diagnosis not present

## 2021-06-13 DIAGNOSIS — Z510 Encounter for antineoplastic radiation therapy: Secondary | ICD-10-CM | POA: Diagnosis not present

## 2021-06-14 ENCOUNTER — Ambulatory Visit: Admission: RE | Admit: 2021-06-14 | Payer: Medicare HMO | Source: Ambulatory Visit

## 2021-06-15 ENCOUNTER — Ambulatory Visit: Payer: Medicare HMO

## 2021-06-15 ENCOUNTER — Ambulatory Visit
Admission: RE | Admit: 2021-06-15 | Discharge: 2021-06-15 | Disposition: A | Discharge status: Home / Self Care | Payer: Medicare HMO | Source: Ambulatory Visit | Attending: Radiation Oncology | Admitting: Radiation Oncology

## 2021-06-15 ENCOUNTER — Other Ambulatory Visit: Payer: Self-pay

## 2021-06-15 DIAGNOSIS — Z23 Encounter for immunization: Secondary | ICD-10-CM | POA: Diagnosis not present

## 2021-06-15 DIAGNOSIS — R7303 Prediabetes: Secondary | ICD-10-CM | POA: Diagnosis not present

## 2021-06-15 DIAGNOSIS — D0511 Intraductal carcinoma in situ of right breast: Secondary | ICD-10-CM | POA: Diagnosis not present

## 2021-06-15 DIAGNOSIS — D0510 Intraductal carcinoma in situ of unspecified breast: Secondary | ICD-10-CM | POA: Diagnosis not present

## 2021-06-15 DIAGNOSIS — I10 Essential (primary) hypertension: Secondary | ICD-10-CM | POA: Diagnosis not present

## 2021-06-15 DIAGNOSIS — Z510 Encounter for antineoplastic radiation therapy: Secondary | ICD-10-CM | POA: Diagnosis not present

## 2021-06-15 DIAGNOSIS — I700 Atherosclerosis of aorta: Secondary | ICD-10-CM | POA: Diagnosis not present

## 2021-06-15 DIAGNOSIS — G309 Alzheimer's disease, unspecified: Secondary | ICD-10-CM | POA: Diagnosis not present

## 2021-06-15 DIAGNOSIS — E7800 Pure hypercholesterolemia, unspecified: Secondary | ICD-10-CM | POA: Diagnosis not present

## 2021-06-15 DIAGNOSIS — R634 Abnormal weight loss: Secondary | ICD-10-CM | POA: Diagnosis not present

## 2021-06-16 ENCOUNTER — Ambulatory Visit
Admission: RE | Admit: 2021-06-16 | Discharge: 2021-06-16 | Disposition: A | Discharge status: Home / Self Care | Payer: Medicare HMO | Source: Ambulatory Visit | Attending: Radiation Oncology | Admitting: Radiation Oncology

## 2021-06-16 DIAGNOSIS — G309 Alzheimer's disease, unspecified: Secondary | ICD-10-CM | POA: Diagnosis not present

## 2021-06-16 DIAGNOSIS — R7303 Prediabetes: Secondary | ICD-10-CM | POA: Diagnosis not present

## 2021-06-16 DIAGNOSIS — I10 Essential (primary) hypertension: Secondary | ICD-10-CM | POA: Diagnosis not present

## 2021-06-16 DIAGNOSIS — D0510 Intraductal carcinoma in situ of unspecified breast: Secondary | ICD-10-CM | POA: Diagnosis not present

## 2021-06-16 DIAGNOSIS — R634 Abnormal weight loss: Secondary | ICD-10-CM | POA: Diagnosis not present

## 2021-06-16 DIAGNOSIS — D0511 Intraductal carcinoma in situ of right breast: Secondary | ICD-10-CM | POA: Diagnosis not present

## 2021-06-16 DIAGNOSIS — Z23 Encounter for immunization: Secondary | ICD-10-CM | POA: Diagnosis not present

## 2021-06-16 DIAGNOSIS — E7800 Pure hypercholesterolemia, unspecified: Secondary | ICD-10-CM | POA: Diagnosis not present

## 2021-06-16 DIAGNOSIS — I700 Atherosclerosis of aorta: Secondary | ICD-10-CM | POA: Diagnosis not present

## 2021-06-16 DIAGNOSIS — Z510 Encounter for antineoplastic radiation therapy: Secondary | ICD-10-CM | POA: Diagnosis not present

## 2021-06-17 ENCOUNTER — Ambulatory Visit
Admission: RE | Admit: 2021-06-17 | Discharge: 2021-06-17 | Disposition: A | Discharge status: Home / Self Care | Payer: Medicare HMO | Source: Ambulatory Visit | Attending: Radiation Oncology | Admitting: Radiation Oncology

## 2021-06-17 ENCOUNTER — Other Ambulatory Visit: Payer: Self-pay

## 2021-06-17 DIAGNOSIS — D0511 Intraductal carcinoma in situ of right breast: Secondary | ICD-10-CM | POA: Diagnosis not present

## 2021-06-17 DIAGNOSIS — Z510 Encounter for antineoplastic radiation therapy: Secondary | ICD-10-CM | POA: Diagnosis not present

## 2021-06-20 ENCOUNTER — Other Ambulatory Visit: Payer: Self-pay

## 2021-06-20 ENCOUNTER — Encounter: Payer: Self-pay | Admitting: *Deleted

## 2021-06-20 ENCOUNTER — Ambulatory Visit
Admission: RE | Admit: 2021-06-20 | Discharge: 2021-06-20 | Disposition: A | Discharge status: Home / Self Care | Payer: Medicare HMO | Source: Ambulatory Visit | Attending: Radiation Oncology | Admitting: Radiation Oncology

## 2021-06-20 ENCOUNTER — Ambulatory Visit: Payer: Medicare HMO

## 2021-06-20 DIAGNOSIS — D0511 Intraductal carcinoma in situ of right breast: Secondary | ICD-10-CM

## 2021-06-20 DIAGNOSIS — Z510 Encounter for antineoplastic radiation therapy: Secondary | ICD-10-CM | POA: Diagnosis not present

## 2021-06-21 ENCOUNTER — Ambulatory Visit
Admission: RE | Admit: 2021-06-21 | Discharge: 2021-06-21 | Disposition: A | Discharge status: Home / Self Care | Payer: Medicare HMO | Source: Ambulatory Visit | Attending: Radiation Oncology | Admitting: Radiation Oncology

## 2021-06-21 ENCOUNTER — Encounter: Payer: Self-pay | Admitting: Radiation Oncology

## 2021-06-21 DIAGNOSIS — D0511 Intraductal carcinoma in situ of right breast: Secondary | ICD-10-CM | POA: Diagnosis not present

## 2021-06-21 DIAGNOSIS — Z510 Encounter for antineoplastic radiation therapy: Secondary | ICD-10-CM | POA: Diagnosis not present

## 2021-06-27 ENCOUNTER — Other Ambulatory Visit: Payer: Self-pay

## 2021-06-27 ENCOUNTER — Encounter: Payer: Self-pay | Admitting: Hematology and Oncology

## 2021-06-27 ENCOUNTER — Inpatient Hospital Stay: Payer: Medicare HMO | Attending: Hematology and Oncology | Admitting: Hematology and Oncology

## 2021-06-27 VITALS — BP 128/72 | HR 67 | Temp 97.3°F | Resp 17 | Wt 119.4 lb

## 2021-06-27 DIAGNOSIS — Z8249 Family history of ischemic heart disease and other diseases of the circulatory system: Secondary | ICD-10-CM | POA: Diagnosis not present

## 2021-06-27 DIAGNOSIS — Z923 Personal history of irradiation: Secondary | ICD-10-CM | POA: Diagnosis not present

## 2021-06-27 DIAGNOSIS — Z79899 Other long term (current) drug therapy: Secondary | ICD-10-CM | POA: Diagnosis not present

## 2021-06-27 DIAGNOSIS — D0511 Intraductal carcinoma in situ of right breast: Secondary | ICD-10-CM | POA: Diagnosis not present

## 2021-06-27 DIAGNOSIS — I10 Essential (primary) hypertension: Secondary | ICD-10-CM | POA: Diagnosis not present

## 2021-06-27 DIAGNOSIS — E785 Hyperlipidemia, unspecified: Secondary | ICD-10-CM | POA: Diagnosis not present

## 2021-06-27 DIAGNOSIS — F0390 Unspecified dementia without behavioral disturbance: Secondary | ICD-10-CM | POA: Insufficient documentation

## 2021-06-27 DIAGNOSIS — M8580 Other specified disorders of bone density and structure, unspecified site: Secondary | ICD-10-CM | POA: Insufficient documentation

## 2021-06-27 MED ORDER — ANASTROZOLE 1 MG PO TABS: 1.0000 mg | ORAL_TABLET | Freq: Every day | ORAL | 3 refills | Status: DC

## 2021-06-27 NOTE — Progress Notes (Signed)
[facility] CONSULT NOTE  Patient Care Team: Gaynelle Arabian, MD as PCP - General (Family Medicine) Cameron Sprang, MD as Consulting Physician (Neurology) Mauro Kaufmann, RN as Oncology Nurse Navigator Rockwell Germany, RN as Oncology Nurse Navigator  CHIEF COMPLAINTS/PURPOSE OF CONSULTATION:  DCIS right breast  ASSESSMENT & PLAN:  This is a very pleasant 70 year old female patient with past medical history significant for hypertension, dyslipidemia, dementia/memory loss who was recently diagnosed to have right breast low-grade DCIS measuring 1.5 cm status post right breast lumpectomy who is here for follow-up and recommendations.  During our initial visit, we discussed role of adjuvant endocrine therapy, discussed about tamoxifen, aromatase inhibitors, mechanism of action and adverse effects.  Today, we have discussed again about proceeding with adjuvant endocrine therapy We have discussed about adverse effects including post menopausal symptoms, bone loss and joint aches Baseline bone density showed osteopenia.  Recommended Ca/Vit D supplementation and weight bearing exercises. I recommended that she see a dentist before proceeding with bisphosphonate, husband will try to call insurance and set her up with a dentist if possible. She will RTC in 4 months She was encouraged to call us with any new medication concerns.  HISTORY OF PRESENTING ILLNESS:   Natasha Cobb 70 y.o. female is here because of DCIS right breast.  Right breast diagnostic mammogram done on December 14, 2020 showed a 0.5 cm group of round calcifications in the right breast at 930, 9 cm from the nipple.  There is a second stable group of 5 round calcifications in the right breast upper outer aspect middle depth.  The 0.5 cm grouped calcifications in the right breast at 9:00 middle depth are increasing in number and stereotactic biopsy is recommended to exclude high risk lesion or DCIS.  Right medial  breast core biopsy on December 29, 2020, 9:00, 9 cm from nipple showed sclerotic ductal papilloma with usual ductal epithelial hyperplasia and calcifications.  She had right breast lumpectomy by Dr. Ninfa Linden on April 06, 2021.  Right breast lumpectomy showed ductal carcinoma in situ with calcifications, margins not involved, measuring 1.5 cm, low-grade, all margins negative, prognostic showed ER +90%, strong staining, PR positive, 95%, strong staining.  She is undergoing adjuvant radiation, completed it 9.20.2022.  She is here with her husband, doing ok.  She says radiation wasn't bad. No complaints today. She hasn't seen a dentist in many years, unaware of dental issues. Rest of the pertinent 10 point ROS reviewed and negative.  MEDICAL HISTORY:  Past Medical History:  Diagnosis Date   Concussion 02/27/2018   Dementia ([place])    Hyperlipidemia    Hypertension     SURGICAL HISTORY: Past Surgical History:  Procedure Laterality Date   BREAST LUMPECTOMY WITH RADIOACTIVE SEED LOCALIZATION Right 03/31/2021   Procedure: RIGHT BREAST LUMPECTOMY WITH RADIOACTIVE SEED LOCALIZATION;  Surgeon: Coralie Keens, MD;  Location: [place];  Service: General;  Laterality: Right;   NO PAST SURGERIES      SOCIAL HISTORY: Social History   Socioeconomic History   Marital status: Married    Spouse name: Not on file   Number of children: Not on file   Years of education: 12   Highest education level: High school graduate  Occupational History   Occupation: Retired  Tobacco Use   Smoking status: Never   Smokeless tobacco: Never  Vaping Use   Vaping Use: Not on file  Substance and Sexual Activity   Alcohol use: No   Drug use:  Never   Sexual activity: Not Currently    Partners: Male    Birth control/protection: Post-menopausal  Other Topics Concern   Not on file  Social History Narrative   Lives with husband and two dogs      Right handed      Split level home      Highest  level of edu- 12th grade   Social Determinants of Health   Financial Resource Strain: Not on file  Food Insecurity: Not on file  Transportation Needs: Not on file  Physical Activity: Not on file  Stress: Not on file  Social Connections: Not on file  Intimate Partner Violence: Not on file    FAMILY HISTORY: Family History  Problem Relation Age of Onset   Hypertension Other    Dementia Neg Hx     ALLERGIES:  has No Known Allergies.  MEDICATIONS:  Current Outpatient Medications  Medication Sig Dispense Refill   amLODipine (NORVASC) 10 MG tablet Take 10 mg by mouth daily.     atorvastatin (LIPITOR) 40 MG tablet Take 40 mg by mouth daily.     donepezil (ARICEPT) 10 MG tablet Take 1 tablet daily 30 tablet 11   metoprolol succinate (TOPROL-XL) 100 MG 24 hr tablet Take 100 mg by mouth daily.     traMADol (ULTRAM) 50 MG tablet Take 1 tablet (50 mg total) by mouth every 6 (six) hours as needed for moderate pain. 20 tablet 0   No current facility-administered medications for this visit.    PHYSICAL EXAMINATION: ECOG PERFORMANCE STATUS: 0 - Asymptomatic  BP 128/72 (BP Location: Left Arm, Patient Position: Sitting)   Pulse 67   Temp (!) 97.3 F (36.3 C) (Oral)   Resp 17   Wt 119 lb 6.4 oz (54.2 kg)   SpO2 100%   BMI 20.49 kg/m   General: Alert, oriented and in no acute distress Breast: on inspection post radiation changes with skin hyperpigmentation and some erythema, rest of the exam deferred.  LABORATORY DATA:  I have reviewed the data as listed No results found for: WBC, HGB, HCT, MCV, PLT   Chemistry   No results found for: NA, K, CL, CO2, BUN, CREATININE, GLU No results found for: CALCIUM, ALKPHOS, AST, ALT, BILITOT     RADIOGRAPHIC STUDIES: I have personally reviewed the radiological images as listed and agreed with the findings in the report. DG Bone Density  Result Date: 06/01/2021 EXAM: DUAL X-RAY ABSORPTIOMETRY (DXA) FOR BONE MINERAL DENSITY IMPRESSION:  Referring Physician:  Benay Pike Your patient completed a bone mineral density test using GE Lunar iDXA system (analysis version: 16). Technologist: Novice PATIENT: Name: Natasha, Cobb Patient ID: [id number] Birth Date: Feb 22, 1951 Height: 65.5 in. Sex: Female Measured: 06/01/2021 Weight: 120.2 lbs. Indications: Aricept, Breast Cancer History, Estrogen Deficient, Postmenopausal, Secondary Osteoporosis Fractures: None Treatments: None ASSESSMENT: The BMD measured at Femur Total Right is 0.753 g/cm2 with a T-score of -2.0. This patient is considered osteopenic/low bone mass according to [facility]) criteria. The quality of the exam is good. The lumbar spine was excluded due to degenerative changes. Site Region Measured Date Measured Age YA BMD Significant CHANGE T-score DualFemur Total Right 06/01/2023 67.7 -2.0 0.753 g/cm2 DualFemur Total Mean 06/01/2021 67.7 -2.0 0.754 g/cm2 Left Forearm Radius 33% 06/01/2021 67.7 -1.7 0.739 g/cm2 World Health Organization [facility]) criteria for post-menopausal, Caucasian Women: Normal       T-score at or above -1 SD Osteopenia   T-score between -1 and -2.5 SD Osteoporosis T-score  at or below -2.5 SD RECOMMENDATION: 1. All patients should optimize calcium and vitamin D intake. 2. Consider FDA-approved medical therapies in postmenopausal women and men aged 26 years and older, based on the following: a. A hip or vertebral (clinical or morphometric) fracture. b. T-score = -2.5 at the femoral neck or spine after appropriate evaluation to exclude secondary causes. c. Low bone mass (T-score between -1.0 and -2.5 at the femoral neck or spine) and a 10-year probability of a hip fracture = 3% or a 10-year probability of a major osteoporosis-related fracture = 20% based on the [place]-adapted WHO algorithm. d. Clinician judgment and/or patient preferences may indicate treatment for people with 10-year fracture probabilities above or below these levels. FOLLOW-UP: Patients with  diagnosis of osteoporosis or at high risk for fracture should have regular bone mineral density tests.? Patients eligible for Medicare are allowed routine testing every 2 years.? The testing frequency can be increased to one year for patients who have rapidly progressing disease, are receiving or discontinuing medical therapy to restore bone mass, or have additional risk factors. I have reviewed this study and agree with the findings. [facility] FRAX* 10-year Probability of Fracture Based on femoral neck BMD: DualFemur (Left) Major Osteoporotic Fracture: 3.8% Hip Fracture:                0.6% Population:                  [place] (Black) Risk Factors:                Secondary Osteoporosis *FRAX is a [place] [employer] for Metabolic Bone Disease, a [place] (WHO) [facility]. ASSESSMENT: The probability of a major osteoporotic fracture is 3.8% within the next ten years. The probability of a hip fracture is 0.6% within the next ten years. Electronically Signed   By: Rolm Baptise M.D.   On: 06/01/2021 14:33    All questions were answered. The patient knows to call the clinic with any problems, questions or concerns. I spent 30 minutes in the care of the patient including H and P, review of records, counseling and coordination of care. We discussed mechanism of action of AI, adverse effects of AI, treatment of osteopenia, role of bisphosphonate.    Benay Pike, MD 06/27/2021 9:41 AM

## 2021-07-11 ENCOUNTER — Encounter: Payer: Self-pay | Admitting: Radiation Oncology

## 2021-07-11 ENCOUNTER — Telehealth: Payer: Self-pay | Admitting: Adult Health

## 2021-07-11 NOTE — Telephone Encounter (Signed)
Scheduled per 10/8 sch msg, pt has been called and confirmed appt. Calender will also be sent per pt request

## 2021-07-20 NOTE — Progress Notes (Signed)
Radiation Oncology         [phone] ________________________________  Name: Natasha Cobb MRN: [id number]  Date: 07/21/2021  DOB: Mar 27, 1951  Follow-Up Visit Note  CC: Gaynelle Arabian, MD  Coralie Keens, MD    ICD-10-CM   1. Ductal carcinoma in situ of right breast  D05.11       Diagnosis:  Right Breast LOQ, Low-Grade Ductal Carcinoma in situ, ER+ / PR+  Interval Since Last Radiation: 1 month  Intent: Curative  Radiation Treatment Dates: 05/23/2021 through 06/21/2021 Site Technique Total Dose (Gy) Dose per Fx (Gy) Completed Fx Beam Energies  Breast, Right: Breast_Rt 3D 40.05/40.05 2.67 15/15 6X  Breast, Right: Breast_Rt_Bst 3D 10/10 2 5/5 6X    Narrative:  The patient returns today for routine follow-up. She tolerated her radiation treatment quite well with the exception of some hyperpigmentation changes to her right breast. No skin breakdown was noted on physical exam performed.   Pertinent imaging since her initial consultation date includes dxa for bone density performed on 06/01/21 which revealed the patient as osteopenic, with a right femoral T-score of -2.0.   Since her final treatment date, the patient followed up with Dr. Chryl Heck on 06/27/21 to further discuss adjuvant endocrine therapy. Since the patient's most recent bone density study revealed osteopenia, the patient was recommended Ca/Vit D supplementation and weight bearing exercises. The patient was also advised to see a dentist before proceeding with bisphosphonate. The patient and her husband are doing their best to work with their insurance company to set her up with a dentist as soon as possible.   She denies any discomfort within the right breast area or itching.  She denies any problems with right arm mobility or swelling in her right arm or hand.                                Allergies:  has No Known Allergies.  Meds: Current Outpatient Medications  Medication Sig Dispense Refill   amLODipine  (NORVASC) 10 MG tablet Take 10 mg by mouth daily.     anastrozole (ARIMIDEX) 1 MG tablet Take 1 tablet (1 mg total) by mouth daily. 30 tablet 3   atorvastatin (LIPITOR) 40 MG tablet Take 40 mg by mouth daily.     donepezil (ARICEPT) 10 MG tablet Take 1 tablet daily 30 tablet 11   metoprolol succinate (TOPROL-XL) 100 MG 24 hr tablet Take 100 mg by mouth daily.     traMADol (ULTRAM) 50 MG tablet Take 1 tablet (50 mg total) by mouth every 6 (six) hours as needed for moderate pain. 20 tablet 0   No current facility-administered medications for this encounter.    Physical Findings: The patient is in no acute distress. Patient is alert and oriented.  height is 5\' 4"  (1.626 m) and weight is 123 lb 6.4 oz (56 kg). Her temperature is 97.8 F (36.6 C). Her blood pressure is 120/63 and her pulse is 55 (abnormal). Her respiration is 20 and oxygen saturation is 100%. .  No significant changes. Lungs are clear to auscultation bilaterally. Heart has regular rate and rhythm. No palpable cervical, supraclavicular, or axillary adenopathy. Abdomen soft, non-tender, normal bowel sounds.  Examination of the left breast reveals no palpable mass nipple discharge or bleeding.  Examination right breast reveals some mild hyperpigmentation changes.  Skin is healed well at this time.  No dominant mass appreciated in the breast nipple discharge or  bleeding.   Lab Findings: No results found for: WBC, HGB, HCT, MCV, PLT  Radiographic Findings: No results found.  Impression:  Right Breast LOQ, Low-Grade Ductal Carcinoma in situ, ER+ / PR+  The patient has recovered well from her radiation therapy.  No residual side effects at this time.  No evidence of recurrence on clinical exam today.  Plan: As needed follow-up in radiation oncology.  The patient will continue close follow-up with medical oncology and continue on adjuvant hormonal therapy.    ____________________________________  Blair Promise, PhD,  MD   This document serves as a record of services personally performed by Gery Pray, MD. It was created on his behalf by Roney Mans, a trained medical scribe. The creation of this record is based on the scribe's personal observations and the provider's statements to them. This document has been checked and approved by the attending provider.

## 2021-07-20 NOTE — Progress Notes (Incomplete)
  Radiation Oncology         [phone] ________________________________  Patient Name: Natasha Cobb MRN: [id number] DOB: 1950-11-28 Referring Physician: Coralie Keens (Profile Not Attached) Date of Service: 06/21/2021 [facility], [place]  End Of Treatment Note  Diagnoses: D05.11-Intraductal carcinoma in situ of right breast  Cancer Staging: Right Breast LOQ, Low-Grade Ductal Carcinoma in situ, ER+ / PR+  Intent: Curative  Radiation Treatment Dates: 05/23/2021 through 06/21/2021 Site Technique Total Dose (Gy) Dose per Fx (Gy) Completed Fx Beam Energies  Breast, Right: Breast_Rt 3D 40.05/40.05 2.67 15/15 6X  Breast, Right: Breast_Rt_Bst 3D 10/10 2 5/5 6X   Narrative: The patient tolerated radiation therapy quite well. She denies any issues or complaints at this time. On physical exam, the right breast area shows hyperpigmentation changes but no skin breakdown.  Plan: The patient will follow-up with radiation oncology in one month .  ________________________________________________ -----------------------------------  Blair Promise, PhD, MD  This document serves as a record of services personally performed by Gery Pray, MD. It was created on his behalf by Roney Mans, a trained medical scribe. The creation of this record is based on the scribe's personal observations and the provider's statements to them. This document has been checked and approved by the attending provider.

## 2021-07-21 ENCOUNTER — Ambulatory Visit
Admission: RE | Admit: 2021-07-21 | Discharge: 2021-07-21 | Disposition: A | Discharge status: Home / Self Care | Payer: Medicare HMO | Source: Ambulatory Visit | Attending: Radiation Oncology | Admitting: Radiation Oncology

## 2021-07-21 ENCOUNTER — Encounter: Payer: Self-pay | Admitting: Radiation Oncology

## 2021-07-21 ENCOUNTER — Other Ambulatory Visit: Payer: Self-pay

## 2021-07-21 VITALS — BP 120/63 | HR 55 | Temp 97.8°F | Resp 20 | Ht 64.0 in | Wt 123.4 lb

## 2021-07-21 DIAGNOSIS — Z79899 Other long term (current) drug therapy: Secondary | ICD-10-CM | POA: Diagnosis not present

## 2021-07-21 DIAGNOSIS — D0511 Intraductal carcinoma in situ of right breast: Secondary | ICD-10-CM | POA: Diagnosis not present

## 2021-07-21 DIAGNOSIS — M8580 Other specified disorders of bone density and structure, unspecified site: Secondary | ICD-10-CM | POA: Diagnosis not present

## 2021-07-21 DIAGNOSIS — Z923 Personal history of irradiation: Secondary | ICD-10-CM | POA: Insufficient documentation

## 2021-07-21 DIAGNOSIS — Z170 Estrogen receptor positive status [ER+]: Secondary | ICD-10-CM | POA: Insufficient documentation

## 2021-07-21 HISTORY — DX: Malignant neoplasm of unspecified site of unspecified female breast: C50.919

## 2021-07-21 HISTORY — DX: Personal history of irradiation: Z92.3

## 2021-07-21 NOTE — Progress Notes (Signed)
Natasha Cobb is here today for follow up post radiation to the breast.   Breast Side:right   They completed their radiation on: 06/21/2021   Does the patient complain of any of the following: Post radiation skin issues: denies Breast Tenderness: denies Breast Swelling: denies Lymphadema: denies Range of Motion limitations: demonstrates full range of motion Fatigue post radiation: denies Appetite good/fair/poor: good  Additional comments if applicable: none  Vitals:   07/21/21 1017  BP: 120/63  Pulse: (!) 55  Resp: 20  Temp: 97.8 F (36.6 C)  SpO2: 100%  Weight: 123 lb 6.4 oz (56 kg)  Height: 5\' 4"  (1.626 m)

## 2021-08-19 ENCOUNTER — Telehealth: Payer: Self-pay | Admitting: *Deleted

## 2021-09-01 ENCOUNTER — Telehealth: Payer: Self-pay | Admitting: *Deleted

## 2021-09-04 DIAGNOSIS — R630 Anorexia: Secondary | ICD-10-CM | POA: Diagnosis not present

## 2021-09-06 ENCOUNTER — Encounter: Payer: Medicare HMO | Admitting: Adult Health

## 2021-09-07 NOTE — Progress Notes (Signed)
SURVIVORSHIP VISIT:    BRIEF ONCOLOGIC HISTORY:  Oncology History  Ductal carcinoma in situ of right breast  12/14/2020 Initial Diagnosis   Right breast diagnostic mammogram done on December 14, 2020 showed a 0.5 cm group of round calcifications in the right breast at 930, 9 cm from the nipple.  There is a second stable group of 5 round calcifications in the right breast upper outer aspect middle depth.  The 0.5 cm grouped calcifications in the right breast at 9:00 middle depth are increasing in number and stereotactic biopsy is recommended to exclude high risk lesion or DCIS.   Right medial breast core biopsy on December 29, 2020, 9:00, 9 cm from nipple showed sclerotic ductal papilloma with usual ductal epithelial hyperplasia and calcifications.     04/06/2021 Surgery   Right breast lumpectomy showed ductal carcinoma in situ with calcifications, margins not involved, measuring 1.5 cm, low-grade, all margins negative, prognostic showed ER +90%, strong staining, PR positive, 95%, strong staining.    04/06/2021 Cancer Staging   Staging form: Breast, AJCC 8th Edition - Pathologic stage from 04/06/2021: Stage 0 (pTis (DCIS), pN0, cM0, ER+, PR+) - Signed by Gardenia Phlegm, NP on 09/07/2021    05/23/2021 - 06/21/2021 Radiation Therapy   05/23/2021 through 06/21/2021 Site Technique Total Dose (Gy) Dose per Fx (Gy) Completed Fx Beam Energies  Breast, Right: Breast_Rt 3D 40.05/40.05 2.67 15/15 6X  Breast, Right: Breast_Rt_Bst 3D 10/10 2 5/5 6X    07/2021 -  Anti-estrogen oral therapy   Anastrozole daily     INTERVAL HISTORY:  Ms. Waight to review her survivorship care plan detailing her treatment course for breast cancer, as well as monitoring long-term side effects of that treatment, education regarding health maintenance, screening, and overall wellness and health promotion.     Overall, Ms. Mccleese reports feeling quite well.  She is not taking the Anastrozole.  Her husband notes that she  sometimes doesn't take her anastrozole and often doesn't want to take them.     She has lost weight.  In July, 2022 she was 128 pounds, and today she is 110 pounds.  She does not have any blood in her stool or black tarry stool.  Her husband notes her colonoscopy was completed approximately 12-15 years ago.  On her chart from Epic care everywhere there is a family history of colon neoplasm, however Sondi cannot identify who had the neoplasm.      REVIEW OF SYSTEMS:  Review of Systems  Constitutional:  Positive for appetite change and unexpected weight change. Negative for chills, fatigue and fever.  HENT:   Negative for hearing loss, lump/mass and trouble swallowing.   Eyes:  Negative for eye problems and icterus.  Respiratory:  Negative for chest tightness, cough and shortness of breath.   Cardiovascular:  Negative for chest pain, leg swelling and palpitations.  Gastrointestinal:  Negative for abdominal distention, abdominal pain, constipation, diarrhea, nausea and vomiting.  Endocrine: Negative for hot flashes.  Genitourinary:  Negative for difficulty urinating.   Musculoskeletal:  Negative for arthralgias.  Skin:  Negative for itching and rash.  Neurological:  Negative for dizziness, extremity weakness, headaches and numbness.  Hematological:  Negative for adenopathy. Does not bruise/bleed easily.  Psychiatric/Behavioral:  Negative for depression. The patient is not nervous/anxious.   Breast: Denies any new nodularity, masses, tenderness, nipple changes, or nipple discharge.      ONCOLOGY TREATMENT TEAM:  1. Surgeon:  Dr. Ninfa Linden at [facility] 2. Medical Oncologist: Dr. Chryl Heck  3. Radiation Oncologist: Dr. Sondra Come    PAST MEDICAL/SURGICAL HISTORY:  Past Medical History:  Diagnosis Date   Breast cancer ([place])    Concussion 02/27/2018   Dementia ([place])    History of radiation therapy    Right breast- 05/23/21-06/21/21- Dr. Gery Pray   Hyperlipidemia     Hypertension    Past Surgical History:  Procedure Laterality Date   BREAST LUMPECTOMY WITH RADIOACTIVE SEED LOCALIZATION Right 03/31/2021   Procedure: RIGHT BREAST LUMPECTOMY WITH RADIOACTIVE SEED LOCALIZATION;  Surgeon: Coralie Keens, MD;  Location: [facility];  Service: General;  Laterality: Right;   NO PAST SURGERIES       ALLERGIES:  Allergies  Allergen Reactions   Ace Inhibitors     Other reaction(s): Cough     CURRENT MEDICATIONS:  Outpatient Encounter Medications as of 09/08/2021  Medication Sig   amLODipine (NORVASC) 10 MG tablet Take 10 mg by mouth daily.   anastrozole (ARIMIDEX) 1 MG tablet Take 1 tablet (1 mg total) by mouth daily.   atorvastatin (LIPITOR) 40 MG tablet Take 40 mg by mouth daily.   donepezil (ARICEPT) 10 MG tablet Take 1 tablet daily   metoprolol succinate (TOPROL-XL) 100 MG 24 hr tablet Take 100 mg by mouth daily.   traMADol (ULTRAM) 50 MG tablet Take 1 tablet (50 mg total) by mouth every 6 (six) hours as needed for moderate pain.   No facility-administered encounter medications on file as of 09/08/2021.     ONCOLOGIC FAMILY HISTORY:  Family History  Problem Relation Age of Onset   Hypertension Other    Dementia Neg Hx      GENETIC COUNSELING/TESTING: Not at this time  SOCIAL HISTORY:  Social History   Socioeconomic History   Marital status: Married    Spouse name: Not on file   Number of children: Not on file   Years of education: 12   Highest education level: High school graduate  Occupational History   Occupation: Retired  Tobacco Use   Smoking status: Never   Smokeless tobacco: Never  Scientific laboratory technician Use: Not on file  Substance and Sexual Activity   Alcohol use: No   Drug use: Never   Sexual activity: Not Currently    Partners: Male    Birth control/protection: Post-menopausal  Other Topics Concern   Not on file  Social History Narrative   Lives with husband and two dogs      Right handed       Split level home      Highest level of edu- 12th grade   Social Determinants of Health   Financial Resource Strain: Low Risk    Difficulty of Paying Living Expenses: Not hard at all  Food Insecurity: No Food Insecurity   Worried About Charity fundraiser in the Last Year: Never true   [place] in the Last Year: Never true  Transportation Needs: No Transportation Needs   Lack of Transportation (Medical): No   Lack of Transportation (Non-Medical): No  Physical Activity: Insufficiently Active   Days of Exercise per Week: 1 day   Minutes of Exercise per Session: 60 min  Stress: No Stress Concern Present   Feeling of Stress : Not at all  Social Connections: Moderately Integrated   Frequency of Communication with Friends and Family: More than three times a week   Frequency of Social Gatherings with Friends and Family: More than three times a week   Attends Religious Services: 1  to 4 times per year   Active Member of Clubs or Organizations: No   Attends Archivist Meetings: Never   Marital Status: Married  Human resources officer Violence: Not At Risk   Fear of Current or Ex-Partner: No   Emotionally Abused: No   Physically Abused: No   Sexually Abused: No     OBSERVATIONS/OBJECTIVE:  BP 133/82 (BP Location: Right Arm, Patient Position: Sitting)   Pulse 81   Temp 98.1 F (36.7 C) (Temporal)   Resp 18   Ht 5\' 4"  (1.626 m)   Wt 110 lb 14.4 oz (50.3 kg)   SpO2 100%   BMI 19.04 kg/m  GENERAL: Patient is a well appearing female in no acute distress HEENT:  Sclerae anicteric.  Oropharynx clear and moist. No ulcerations or evidence of oropharyngeal candidiasis. Neck is supple.  NODES:  No cervical, supraclavicular, or axillary lymphadenopathy palpated.  BREAST EXAM: Right breast is status postlumpectomy and radiation left breast is benign. LUNGS:  Clear to auscultation bilaterally.  No wheezes or rhonchi. HEART:  Regular rate and rhythm. No murmur appreciated. ABDOMEN:   Soft, nontender.  Positive, normoactive bowel sounds. No organomegaly palpated. MSK:  No focal spinal tenderness to palpation. Full range of motion bilaterally in the upper extremities. EXTREMITIES:  No peripheral edema.   SKIN:  Clear with no obvious rashes or skin changes. No nail dyscrasia. NEURO:  Nonfocal.  The patient is oriented.  Throughout her interaction however, I noted that she got lost in her activities.  At the physical exam completion she forgot to button her dress up and was going to walk out of the exam room into the lobby that way.   LABORATORY DATA:  None for this visit.  DIAGNOSTIC IMAGING:  None for this visit.      ASSESSMENT AND PLAN:  Ms.. Schoenbeck is a pleasant 70 y.o. female with Stage 0 right breast ductal carcinoma in situ, estrogen and progesterone positive, diagnosed in July 2022, treated with lumpectomy, adjuvant radiation therapy, and antiestrogen therapy with anastrozole beginning in October 2022.  She presents to the Survivorship Clinic for our initial meeting and routine follow-up post-completion of treatment for breast cancer.    1. Stage 0 right breast cancer:  Ms. Ortman is continuing to recover from definitive treatment for breast cancer. She will follow-up with her medical oncologist, Dr. Chryl Heck 6 months with history and physical exam per surveillance protocol.  She is noncompliant with taking her anastrozole.  Her husband Lanae Boast who is with her today notes that he thinks it is because she forgets to take them and and sometimes refuses to take them.  I reviewed with Lanae Boast and Tikita her risk of recurrence with and without antiestrogen therapy based on the [facility] DCIS breast risk algorithm her risk of recurrence in 5 years with antiestrogen therapy is 1% and then 10 years is 2% out of estrogen therapy her risk of recurrence in 5 years is 2% and her risk of recurrence in 10 years is 4%.  I wrote this down for them and her  survivorship care plan as they move forward with deciding whether or not she will continue on the medication. Her mammogram is due March 2023; orders placed today.   Today, a comprehensive survivorship care plan and treatment summary was reviewed with the patient today detailing her breast cancer diagnosis, treatment course, potential late/long-term effects of treatment, appropriate follow-up care with recommendations for the future, and patient education resources.  A copy  of this summary, along with a letter will be sent to the patient's primary care provider via mail/fax/In Basket message after today's visit.    2.  Weight loss:This is highly unlikely to be related to her noninvasive breast cancer.  Considering the way her dementia presented and her visit today, I wonder if some of the could be related to that.  She will see her primary care provider this afternoon and we will follow-up with them for further recommendations.  3. Bone health:  Given Ms. Scovill's age/history of breast cancer and her current treatment regimen including anti-estrogen therapy with anastrozole, she is at risk for bone demineralization.  Her last DEXA scan was on June 01, 2021, which showed osteopenia with a T score of -2.0 in the right femur.  She is recommended to repeat bone density testing in August 2024.  She was given education on specific activities to promote bone health.  4. Cancer screening:  Due to Ms. Polinski's history and her age, she should receive screening for skin cancers, colon cancer, and gynecologic cancers.  The information and recommendations are listed on the patient's comprehensive care plan/treatment summary and were reviewed in detail with the patient.    5. Health maintenance and wellness promotion: Ms. Grego was encouraged to consume 5-7 servings of fruits and vegetables per day. We reviewed the "Nutrition Rainbow" handout, as well as the handout "Take Control of Your Health and Reduce Your Cancer  Risk" from the [place].  She was also encouraged to engage in moderate to vigorous exercise for 30 minutes per day most days of the week. We discussed the [employer] fitness program, which is designed for cancer survivors to help them become more physically fit after cancer treatments.  She was instructed to limit her alcohol consumption and continue to abstain from tobacco use.     6. Support services/counseling: It is not uncommon for this period of the patient's cancer care trajectory to be one of many emotions and stressors.  We discussed how this can be increasingly difficult during the times of quarantine and social distancing due to the COVID-19 pandemic.   She was given information regarding our available services and encouraged to contact me with any questions or for help enrolling in any of our support group/programs.    Follow up instructions:    -Return to cancer center in 6 months -Mammogram due in in March 2023 -Bone density testing in August 2024 -Follow up with surgery in 1 year -She is welcome to return back to the Survivorship Clinic at any time; no additional follow-up needed at this time.  -Consider referral back to survivorship as a long-term survivor for continued surveillance  The patient was provided an opportunity to ask questions and all were answered. The patient agreed with the plan and demonstrated an understanding of the instructions.   Total encounter time: 45 minutes in chart review, lab review, face-to-face visit time, care coordination, order entry, and documentation of the encounter.   Wilber Bihari, NP 09/08/21 8:50 PM Medical Oncology and Hematology [facility] [place], [place] [address] Tel. [phone]    Fax. [phone]  *Total Encounter Time as defined by the Centers for Medicare and Medicaid Services includes, in addition to the face-to-face time of a patient visit (documented in the note above)  non-face-to-face time: obtaining and reviewing outside history, ordering and reviewing medications, tests or procedures, care coordination (communications with other health care professionals or caregivers) and documentation  in the medical record.

## 2021-09-08 ENCOUNTER — Other Ambulatory Visit: Payer: Self-pay

## 2021-09-08 ENCOUNTER — Inpatient Hospital Stay: Payer: Medicare HMO | Attending: Hematology and Oncology | Admitting: Adult Health

## 2021-09-08 ENCOUNTER — Encounter: Payer: Self-pay | Admitting: Adult Health

## 2021-09-08 VITALS — BP 133/82 | HR 81 | Temp 98.1°F | Resp 18 | Ht 64.0 in | Wt 110.9 lb

## 2021-09-08 DIAGNOSIS — I10 Essential (primary) hypertension: Secondary | ICD-10-CM | POA: Insufficient documentation

## 2021-09-08 DIAGNOSIS — D0511 Intraductal carcinoma in situ of right breast: Secondary | ICD-10-CM | POA: Insufficient documentation

## 2021-09-08 DIAGNOSIS — I700 Atherosclerosis of aorta: Secondary | ICD-10-CM | POA: Insufficient documentation

## 2021-09-08 DIAGNOSIS — Z800 Family history of malignant neoplasm of digestive organs: Secondary | ICD-10-CM | POA: Insufficient documentation

## 2021-09-08 DIAGNOSIS — R630 Anorexia: Secondary | ICD-10-CM | POA: Diagnosis not present

## 2021-09-08 DIAGNOSIS — Z170 Estrogen receptor positive status [ER+]: Secondary | ICD-10-CM | POA: Diagnosis not present

## 2021-09-08 DIAGNOSIS — R634 Abnormal weight loss: Secondary | ICD-10-CM | POA: Insufficient documentation

## 2021-09-08 DIAGNOSIS — G309 Alzheimer's disease, unspecified: Secondary | ICD-10-CM | POA: Diagnosis not present

## 2021-09-08 DIAGNOSIS — F0280 Dementia in other diseases classified elsewhere without behavioral disturbance: Secondary | ICD-10-CM | POA: Insufficient documentation

## 2021-09-08 DIAGNOSIS — Z681 Body mass index (BMI) 19 or less, adult: Secondary | ICD-10-CM | POA: Diagnosis not present

## 2021-09-08 DIAGNOSIS — Z79811 Long term (current) use of aromatase inhibitors: Secondary | ICD-10-CM | POA: Diagnosis not present

## 2021-09-08 DIAGNOSIS — Z79899 Other long term (current) drug therapy: Secondary | ICD-10-CM | POA: Insufficient documentation

## 2021-09-08 DIAGNOSIS — R7303 Prediabetes: Secondary | ICD-10-CM | POA: Insufficient documentation

## 2021-09-08 DIAGNOSIS — E7800 Pure hypercholesterolemia, unspecified: Secondary | ICD-10-CM | POA: Insufficient documentation

## 2021-09-08 DIAGNOSIS — R413 Other amnesia: Secondary | ICD-10-CM | POA: Diagnosis not present

## 2021-09-08 DIAGNOSIS — M85851 Other specified disorders of bone density and structure, right thigh: Secondary | ICD-10-CM | POA: Insufficient documentation

## 2021-09-08 DIAGNOSIS — Z8249 Family history of ischemic heart disease and other diseases of the circulatory system: Secondary | ICD-10-CM | POA: Diagnosis not present

## 2021-09-08 DIAGNOSIS — E46 Unspecified protein-calorie malnutrition: Secondary | ICD-10-CM | POA: Diagnosis not present

## 2021-09-08 DIAGNOSIS — F0390 Unspecified dementia without behavioral disturbance: Secondary | ICD-10-CM | POA: Insufficient documentation

## 2021-09-08 DIAGNOSIS — Z86010 Personal history of colonic polyps: Secondary | ICD-10-CM | POA: Insufficient documentation

## 2021-10-12 ENCOUNTER — Encounter: Payer: Self-pay | Admitting: Physician Assistant

## 2021-10-12 ENCOUNTER — Other Ambulatory Visit: Payer: Self-pay

## 2021-10-12 ENCOUNTER — Ambulatory Visit (INDEPENDENT_AMBULATORY_CARE_PROVIDER_SITE_OTHER): Payer: Medicare HMO | Admitting: Physician Assistant

## 2021-10-12 VITALS — BP 119/64 | HR 83 | Resp 18 | Ht 64.0 in | Wt 121.0 lb

## 2021-10-12 DIAGNOSIS — F0280 Dementia in other diseases classified elsewhere without behavioral disturbance: Secondary | ICD-10-CM

## 2021-10-12 DIAGNOSIS — G300 Alzheimer's disease with early onset: Secondary | ICD-10-CM

## 2021-10-12 MED ORDER — MEMANTINE HCL 10 MG PO TABS: ORAL_TABLET | ORAL | 11 refills | Status: DC

## 2021-10-12 NOTE — Patient Instructions (Signed)
Continue Donepezil 10mg  daily  2. Your husband is requested to start using a pillbox and help with your medications  Start Memantine 10mg  tablets.  Take 1 tablet at bedtime for 2 weeks, then 1 tablet twice daily.   Side effects include dizziness, headache, diarrhea or constipation.  Call with any questions or concerns.   3. Follow-up in 6 months, call for any changes   FALL PRECAUTIONS: Be cautious when walking. Scan the area for obstacles that may increase the risk of trips and falls. When getting up in the mornings, sit up at the edge of the bed for a few minutes before getting out of bed. Consider elevating the bed at the head end to avoid drop of blood pressure when getting up. Walk always in a well-lit room (use night lights in the walls). Avoid area rugs or power cords from appliances in the middle of the walkways. Use a walker or a cane if necessary and consider physical therapy for balance exercise. Get your eyesight checked regularly.  FINANCIAL OVERSIGHT: Supervision, especially oversight when making financial decisions or transactions is also recommended.  HOME SAFETY: Consider the safety of the kitchen when operating appliances like stoves, microwave oven, and blender. Consider having supervision and share cooking responsibilities until no longer able to participate in those. Accidents with firearms and other hazards in the house should be identified and addressed as well.  ABILITY TO BE LEFT ALONE: If patient is unable to contact 911 operator, consider using LifeLine, or when the need is there, arrange for someone to stay with patients. Smoking is a fire hazard, consider supervision or cessation. Risk of wandering should be assessed by caregiver and if detected at any point, supervision and safe proof recommendations should be instituted.  MEDICATION SUPERVISION: Inability to self-administer medication needs to be constantly addressed. Implement a mechanism to ensure safe administration  of the medications.  RECOMMENDATIONS FOR ALL PATIENTS WITH MEMORY PROBLEMS: 1. Continue to exercise (Recommend 30 minutes of walking everyday, or 3 hours every week) 2. Increase social interactions - continue going to [place] and enjoy social gatherings with friends and family 3. Eat healthy, avoid fried foods and eat more fruits and vegetables 4. Maintain adequate blood pressure, blood sugar, and blood cholesterol level. Reducing the risk of stroke and cardiovascular disease also helps promoting better memory. 5. Avoid stressful situations. Live a simple life and avoid aggravations. Organize your time and prepare for the next day in anticipation. 6. Sleep well, avoid any interruptions of sleep and avoid any distractions in the bedroom that may interfere with adequate sleep quality 7. Avoid sugar, avoid sweets as there is a strong link between excessive sugar intake, diabetes, and cognitive impairment The Mediterranean diet has been shown to help patients reduce the risk of progressive memory disorders and reduces cardiovascular risk. This includes eating fish, eat fruits and green leafy vegetables, nuts like almonds and hazelnuts, walnuts, and also use olive oil. Avoid fast foods and fried foods as much as possible. Avoid sweets and sugar as sugar use has been linked to worsening of memory function.  There is always a concern of gradual progression of memory problems. If this is the case, then we may need to adjust level of care according to patient needs. Support, both to the patient and caregiver, should then be put into place.
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
## 2021-10-14 ENCOUNTER — Ambulatory Visit: Payer: Medicare HMO | Admitting: Physician Assistant

## 2021-10-27 ENCOUNTER — Inpatient Hospital Stay: Payer: Medicare HMO | Attending: Hematology and Oncology | Admitting: Hematology and Oncology

## 2021-10-27 ENCOUNTER — Encounter: Payer: Self-pay | Admitting: Hematology and Oncology

## 2021-10-27 ENCOUNTER — Other Ambulatory Visit: Payer: Self-pay

## 2021-10-27 VITALS — BP 146/72 | HR 60 | Temp 97.4°F | Resp 17 | Wt 127.3 lb

## 2021-10-27 DIAGNOSIS — Z79899 Other long term (current) drug therapy: Secondary | ICD-10-CM | POA: Diagnosis not present

## 2021-10-27 DIAGNOSIS — M85851 Other specified disorders of bone density and structure, right thigh: Secondary | ICD-10-CM | POA: Insufficient documentation

## 2021-10-27 DIAGNOSIS — D0511 Intraductal carcinoma in situ of right breast: Secondary | ICD-10-CM | POA: Diagnosis not present

## 2021-10-27 DIAGNOSIS — Z923 Personal history of irradiation: Secondary | ICD-10-CM | POA: Diagnosis not present

## 2021-10-27 DIAGNOSIS — Z8249 Family history of ischemic heart disease and other diseases of the circulatory system: Secondary | ICD-10-CM | POA: Insufficient documentation

## 2021-10-27 DIAGNOSIS — I10 Essential (primary) hypertension: Secondary | ICD-10-CM | POA: Insufficient documentation

## 2021-10-27 DIAGNOSIS — Z170 Estrogen receptor positive status [ER+]: Secondary | ICD-10-CM | POA: Insufficient documentation

## 2021-10-27 NOTE — Progress Notes (Signed)
SURVIVORSHIP VISIT:    BRIEF ONCOLOGIC HISTORY:  Oncology History  Ductal carcinoma in situ of right breast  12/14/2020 Initial Diagnosis   Right breast diagnostic mammogram done on December 14, 2020 showed a 0.5 cm group of round calcifications in the right breast at 930, 9 cm from the nipple.  There is a second stable group of 5 round calcifications in the right breast upper outer aspect middle depth.  The 0.5 cm grouped calcifications in the right breast at 9:00 middle depth are increasing in number and stereotactic biopsy is recommended to exclude high risk lesion or DCIS.   Right medial breast core biopsy on December 29, 2020, 9:00, 9 cm from nipple showed sclerotic ductal papilloma with usual ductal epithelial hyperplasia and calcifications.     04/06/2021 Surgery   Right breast lumpectomy showed ductal carcinoma in situ with calcifications, margins not involved, measuring 1.5 cm, low-grade, all margins negative, prognostic showed ER +90%, strong staining, PR positive, 95%, strong staining.    04/06/2021 Cancer Staging   Staging form: Breast, AJCC 8th Edition - Pathologic stage from 04/06/2021: Stage 0 (pTis (DCIS), pN0, cM0, ER+, PR+) - Signed by Gardenia Phlegm, NP on 09/07/2021    05/23/2021 - 06/21/2021 Radiation Therapy   05/23/2021 through 06/21/2021 Site Technique Total Dose (Gy) Dose per Fx (Gy) Completed Fx Beam Energies  Breast, Right: Breast_Rt 3D 40.05/40.05 2.67 15/15 6X  Breast, Right: Breast_Rt_Bst 3D 10/10 2 5/5 6X    07/2021 -  Anti-estrogen oral therapy   Anastrozole daily     INTERVAL HISTORY:   She is here for follow up on arimidex. She denies any adverse effects. She stays active. No arthralgias or bone pains. No change in breathing, bowel or urinary habits. She doesn't take any calcium or Vit D supplements. NO weight bearing exercises, Rest of the pertinent 10 point ROS reviewed and negative.  REVIEW OF SYSTEMS:  Review of Systems  Constitutional:   Positive for appetite change and unexpected weight change (weight has increased). Negative for chills, fatigue and fever.  HENT:   Negative for hearing loss, lump/mass and trouble swallowing.   Eyes:  Negative for eye problems and icterus.  Respiratory:  Negative for chest tightness, cough and shortness of breath.   Cardiovascular:  Negative for chest pain, leg swelling and palpitations.  Gastrointestinal:  Negative for abdominal distention, abdominal pain, constipation, diarrhea, nausea and vomiting.  Endocrine: Negative for hot flashes.  Genitourinary:  Negative for difficulty urinating.   Musculoskeletal:  Negative for arthralgias.  Skin:  Negative for itching and rash.  Neurological:  Negative for dizziness, extremity weakness, headaches and numbness.  Hematological:  Negative for adenopathy. Does not bruise/bleed easily.  Psychiatric/Behavioral:  Negative for depression. The patient is not nervous/anxious.   Breast: Denies any new nodularity, masses, tenderness, nipple changes, or nipple discharge.    ONCOLOGY TREATMENT TEAM:  1. Surgeon:  Dr. Ninfa Linden at [facility] 2. Medical Oncologist: Dr. Chryl Heck  3. Radiation Oncologist: Dr. Sondra Come    PAST MEDICAL/SURGICAL HISTORY:  Past Medical History:  Diagnosis Date   Breast cancer ([place])    Concussion 02/27/2018   Dementia ([place])    History of radiation therapy    Right breast- 05/23/21-06/21/21- Dr. Gery Pray   Hyperlipidemia    Hypertension    Past Surgical History:  Procedure Laterality Date   BREAST LUMPECTOMY WITH RADIOACTIVE SEED LOCALIZATION Right 03/31/2021   Procedure: RIGHT BREAST LUMPECTOMY WITH RADIOACTIVE SEED LOCALIZATION;  Surgeon: Coralie Keens, MD;  Location: [facility]  [place];  Service: General;  Laterality: Right;   NO PAST SURGERIES       ALLERGIES:  Allergies  Allergen Reactions   Ace Inhibitors     Other reaction(s): Cough     CURRENT MEDICATIONS:  Outpatient Encounter  Medications as of 10/27/2021  Medication Sig   amLODipine (NORVASC) 10 MG tablet Take 10 mg by mouth daily.   anastrozole (ARIMIDEX) 1 MG tablet Take 1 tablet (1 mg total) by mouth daily.   atorvastatin (LIPITOR) 40 MG tablet Take 40 mg by mouth daily.   donepezil (ARICEPT) 10 MG tablet Take 1 tablet daily   memantine (NAMENDA) 10 MG tablet Take 1 tablet (10 mg at night) for 2 weeks, then increase to 1 tablet (10 mg) twice a day   metoprolol succinate (TOPROL-XL) 100 MG 24 hr tablet Take 100 mg by mouth daily.   traMADol (ULTRAM) 50 MG tablet Take 1 tablet (50 mg total) by mouth every 6 (six) hours as needed for moderate pain.   No facility-administered encounter medications on file as of 10/27/2021.     ONCOLOGIC FAMILY HISTORY:  Family History  Problem Relation Age of Onset   Hypertension Other    Dementia Neg Hx      GENETIC COUNSELING/TESTING: Not at this time  SOCIAL HISTORY:  Social History   Socioeconomic History   Marital status: Married    Spouse name: Not on file   Number of children: Not on file   Years of education: 12   Highest education level: High school graduate  Occupational History   Occupation: Retired  Tobacco Use   Smoking status: Never   Smokeless tobacco: Never  Scientific laboratory technician Use: Not on file  Substance and Sexual Activity   Alcohol use: No   Drug use: Never   Sexual activity: Not Currently    Partners: Male    Birth control/protection: Post-menopausal  Other Topics Concern   Not on file  Social History Narrative   Lives with husband and two dogs      Right handed      Split level home      Highest level of edu- 12th grade   Social Determinants of Health   Financial Resource Strain: Low Risk    Difficulty of Paying Living Expenses: Not hard at all  Food Insecurity: No Food Insecurity   Worried About Charity fundraiser in the Last Year: Never true   [place] in the Last Year: Never true  Transportation Needs: No  Transportation Needs   Lack of Transportation (Medical): No   Lack of Transportation (Non-Medical): No  Physical Activity: Insufficiently Active   Days of Exercise per Week: 1 day   Minutes of Exercise per Session: 60 min  Stress: No Stress Concern Present   Feeling of Stress : Not at all  Social Connections: Moderately Integrated   Frequency of Communication with Friends and Family: More than three times a week   Frequency of Social Gatherings with Friends and Family: More than three times a week   Attends Religious Services: 1 to 4 times per year   Active Member of [employer] or Organizations: No   Attends Archivist Meetings: Never   Marital Status: Married  Human resources officer Violence: Not At Risk   Fear of Current or Ex-Partner: No   Emotionally Abused: No   Physically Abused: No   Sexually Abused: No     OBSERVATIONS/OBJECTIVE:  BP (!) 146/72 (  BP Location: Left Arm, Patient Position: Sitting) Comment: Nurse aware of patient BP   Pulse 60    Temp (!) 97.4 F (36.3 C) (Temporal)    Resp 17    Wt 127 lb 4.8 oz (57.7 kg)    SpO2 100%    BMI 21.85 kg/m  GENERAL: Patient is a well appearing female in no acute distress HEENT:  Sclerae anicteric.  Oropharynx clear and moist. No ulcerations or evidence of oropharyngeal candidiasis. Neck is supple.  NODES:  No cervical, supraclavicular, or axillary lymphadenopathy palpated.  BREAST EXAM: Right breast is status postlumpectomy and radiation left breast is benign. LUNGS:  Clear to auscultation bilaterally.  No wheezes or rhonchi. HEART:  Regular rate and rhythm. No murmur appreciated. ABDOMEN:  Soft, nontender.  Positive, normoactive bowel sounds. No organomegaly palpated. MSK:  No focal spinal tenderness to palpation. Full range of motion bilaterally in the upper extremities. EXTREMITIES:  No peripheral edema.   SKIN:  Clear with no obvious rashes or skin changes. No nail dyscrasia. NEURO:  Nonfocal.  The patient is oriented.   Throughout her interaction however, I noted that she got lost in her activities. She forgot her bag and I had to give it to her. Breast: Bilateral breasts inspected, no palpable masses or regional adenopathy   LABORATORY DATA:  None for this visit.  DIAGNOSTIC IMAGING:  None for this visit.     ASSESSMENT AND PLAN:  Ms.. Carbone is a pleasant 71 y.o. female with Stage 0 right breast ductal carcinoma in situ, estrogen and progesterone positive, diagnosed in July 2022, treated with lumpectomy, adjuvant radiation therapy, and antiestrogen therapy with anastrozole beginning in October 2022.   She is here for follow up. No concerning ROS. PE findings unremarkable. Mammogram due in March 2023. Given Ms. Mayville's age/history of breast cancer and her current treatment regimen including anti-estrogen therapy with anastrozole, she is at risk for bone demineralization.  Her last DEXA scan was on June 01, 2021, which showed osteopenia with a T score of -2.0 in the right femur.  She is recommended to repeat bone density testing in August 2024.  Recommend ca/vit D supplementation and weight bearing exercises We discussed this again today   Follow up instructions:    -Return to cancer center in 6 months. No concerning findings today - Weight is now improving, she is eating better. -Mammogram due in in March 2023, ordered  -Bone density testing in August 2024, recommended calcium and vit D supplements  Total time spent : 30 minutes.  *Total Encounter Time as defined by the Centers for Medicare and Medicaid Services includes, in addition to the face-to-face time of a patient visit (documented in the note above) non-face-to-face time: obtaining and reviewing outside history, ordering and reviewing medications, tests or procedures, care coordination (communications with other health care professionals or caregivers) and documentation in the medical record.

## 2021-11-03 ENCOUNTER — Other Ambulatory Visit: Payer: Self-pay | Admitting: Hematology and Oncology

## 2021-11-03 ENCOUNTER — Other Ambulatory Visit: Payer: Self-pay | Admitting: Physician Assistant

## 2021-12-09 DIAGNOSIS — H524 Presbyopia: Secondary | ICD-10-CM | POA: Diagnosis not present

## 2021-12-16 DIAGNOSIS — I10 Essential (primary) hypertension: Secondary | ICD-10-CM | POA: Diagnosis not present

## 2021-12-16 DIAGNOSIS — E7800 Pure hypercholesterolemia, unspecified: Secondary | ICD-10-CM | POA: Diagnosis not present

## 2021-12-16 DIAGNOSIS — D0510 Intraductal carcinoma in situ of unspecified breast: Secondary | ICD-10-CM | POA: Diagnosis not present

## 2021-12-16 DIAGNOSIS — I700 Atherosclerosis of aorta: Secondary | ICD-10-CM | POA: Diagnosis not present

## 2021-12-16 DIAGNOSIS — R7303 Prediabetes: Secondary | ICD-10-CM | POA: Diagnosis not present

## 2021-12-16 DIAGNOSIS — Z1389 Encounter for screening for other disorder: Secondary | ICD-10-CM | POA: Diagnosis not present

## 2021-12-16 DIAGNOSIS — G309 Alzheimer's disease, unspecified: Secondary | ICD-10-CM | POA: Diagnosis not present

## 2021-12-16 DIAGNOSIS — N183 Chronic kidney disease, stage 3 unspecified: Secondary | ICD-10-CM | POA: Diagnosis not present

## 2021-12-16 DIAGNOSIS — Z0000 Encounter for general adult medical examination without abnormal findings: Secondary | ICD-10-CM | POA: Diagnosis not present

## 2022-01-10 ENCOUNTER — Encounter (HOSPITAL_COMMUNITY): Payer: Self-pay

## 2022-02-03 ENCOUNTER — Encounter: Payer: Self-pay | Admitting: Physician Assistant

## 2022-02-14 ENCOUNTER — Telehealth: Payer: Self-pay | Admitting: Hematology and Oncology

## 2022-02-14 NOTE — Telephone Encounter (Signed)
Rescheduled appointment per providers template. Left message.  ? ?

## 2022-02-16 DIAGNOSIS — Z853 Personal history of malignant neoplasm of breast: Secondary | ICD-10-CM | POA: Diagnosis not present

## 2022-02-23 ENCOUNTER — Other Ambulatory Visit: Payer: Self-pay

## 2022-02-23 DIAGNOSIS — D0512 Intraductal carcinoma in situ of left breast: Secondary | ICD-10-CM | POA: Diagnosis not present

## 2022-02-23 DIAGNOSIS — R921 Mammographic calcification found on diagnostic imaging of breast: Secondary | ICD-10-CM | POA: Diagnosis not present

## 2022-02-23 DIAGNOSIS — D242 Benign neoplasm of left breast: Secondary | ICD-10-CM | POA: Diagnosis not present

## 2022-03-07 ENCOUNTER — Other Ambulatory Visit: Payer: Self-pay | Admitting: Surgery

## 2022-03-07 DIAGNOSIS — D0512 Intraductal carcinoma in situ of left breast: Secondary | ICD-10-CM | POA: Diagnosis not present

## 2022-03-07 NOTE — Progress Notes (Unsigned)
Natasha Cobb

## 2022-03-09 ENCOUNTER — Ambulatory Visit: Payer: Medicare HMO | Admitting: Hematology and Oncology

## 2022-03-21 ENCOUNTER — Encounter (HOSPITAL_BASED_OUTPATIENT_CLINIC_OR_DEPARTMENT_OTHER): Payer: Self-pay | Admitting: Surgery

## 2022-03-21 ENCOUNTER — Other Ambulatory Visit: Payer: Self-pay

## 2022-03-21 ENCOUNTER — Telehealth: Payer: Self-pay | Admitting: Hematology and Oncology

## 2022-03-21 NOTE — Telephone Encounter (Signed)
Called patient regarding upcoming July appointments, patient has been called and voicemail was left. 

## 2022-03-22 ENCOUNTER — Encounter (HOSPITAL_BASED_OUTPATIENT_CLINIC_OR_DEPARTMENT_OTHER)
Admission: RE | Admit: 2022-03-22 | Discharge: 2022-03-22 | Disposition: A | Discharge status: Home / Self Care | Payer: Medicare HMO | Source: Ambulatory Visit | Attending: Surgery | Admitting: Surgery

## 2022-03-22 DIAGNOSIS — Z01810 Encounter for preprocedural cardiovascular examination: Secondary | ICD-10-CM | POA: Diagnosis not present

## 2022-03-22 NOTE — Progress Notes (Signed)

## 2022-03-29 ENCOUNTER — Encounter (HOSPITAL_BASED_OUTPATIENT_CLINIC_OR_DEPARTMENT_OTHER): Payer: Self-pay | Admitting: Surgery

## 2022-03-29 ENCOUNTER — Ambulatory Visit (HOSPITAL_BASED_OUTPATIENT_CLINIC_OR_DEPARTMENT_OTHER): Payer: Medicare HMO | Admitting: Anesthesiology

## 2022-03-29 ENCOUNTER — Other Ambulatory Visit: Payer: Self-pay

## 2022-03-29 ENCOUNTER — Observation Stay (HOSPITAL_BASED_OUTPATIENT_CLINIC_OR_DEPARTMENT_OTHER)
Admission: RE | Admit: 2022-03-29 | Discharge: 2022-03-30 | Disposition: A | Discharge status: Home / Self Care | Payer: Medicare HMO | Attending: Surgery | Admitting: Surgery

## 2022-03-29 ENCOUNTER — Encounter (HOSPITAL_BASED_OUTPATIENT_CLINIC_OR_DEPARTMENT_OTHER)
Admission: RE | Disposition: A | Discharge status: Home / Self Care | Payer: Self-pay | Source: Home / Self Care | Attending: Surgery

## 2022-03-29 DIAGNOSIS — Z9012 Acquired absence of left breast and nipple: Secondary | ICD-10-CM

## 2022-03-29 DIAGNOSIS — Z170 Estrogen receptor positive status [ER+]: Secondary | ICD-10-CM | POA: Diagnosis not present

## 2022-03-29 DIAGNOSIS — D0512 Intraductal carcinoma in situ of left breast: Secondary | ICD-10-CM | POA: Diagnosis not present

## 2022-03-29 DIAGNOSIS — I10 Essential (primary) hypertension: Secondary | ICD-10-CM | POA: Diagnosis not present

## 2022-03-29 DIAGNOSIS — G309 Alzheimer's disease, unspecified: Secondary | ICD-10-CM | POA: Insufficient documentation

## 2022-03-29 DIAGNOSIS — F0390 Unspecified dementia without behavioral disturbance: Secondary | ICD-10-CM

## 2022-03-29 DIAGNOSIS — Z79899 Other long term (current) drug therapy: Secondary | ICD-10-CM | POA: Diagnosis not present

## 2022-03-29 HISTORY — PX: SIMPLE MASTECTOMY WITH AXILLARY SENTINEL NODE BIOPSY: SHX6098

## 2022-03-29 SURGERY — SIMPLE MASTECTOMY
Anesthesia: General | Site: Breast | Laterality: Left

## 2022-03-29 MED ORDER — MIDAZOLAM HCL 2 MG/2ML IJ SOLN
INTRAMUSCULAR | Status: AC
  Filled 2022-03-29: qty 2

## 2022-03-29 MED ORDER — OXYCODONE HCL 5 MG PO TABS: 5.0000 mg | ORAL_TABLET | Freq: Once | ORAL | Status: DC | PRN

## 2022-03-29 MED ORDER — METOPROLOL TARTRATE 25 MG PO TABS
50.0000 mg | ORAL_TABLET | Freq: Once | ORAL | Status: AC
  Administered 2022-03-29: 50 mg via ORAL

## 2022-03-29 MED ORDER — DIPHENHYDRAMINE HCL 50 MG/ML IJ SOLN: 12.5000 mg | Freq: Four times a day (QID) | INTRAMUSCULAR | Status: DC | PRN

## 2022-03-29 MED ORDER — PROPOFOL 10 MG/ML IV BOLUS
INTRAVENOUS | Status: DC | PRN
  Administered 2022-03-29: 100 mg via INTRAVENOUS

## 2022-03-29 MED ORDER — KETOROLAC TROMETHAMINE 30 MG/ML IJ SOLN
INTRAMUSCULAR | Status: DC | PRN
  Administered 2022-03-29: 15 mg via INTRAVENOUS

## 2022-03-29 MED ORDER — MEPERIDINE HCL 25 MG/ML IJ SOLN: 6.2500 mg | INTRAMUSCULAR | Status: DC | PRN

## 2022-03-29 MED ORDER — OXYCODONE HCL 5 MG PO TABS
5.0000 mg | ORAL_TABLET | ORAL | Status: DC | PRN
  Administered 2022-03-29: 5 mg via ORAL
  Filled 2022-03-29: qty 1

## 2022-03-29 MED ORDER — ONDANSETRON 4 MG PO TBDP: 4.0000 mg | ORAL_TABLET | Freq: Four times a day (QID) | ORAL | Status: DC | PRN

## 2022-03-29 MED ORDER — ACETAMINOPHEN 500 MG PO TABS
1000.0000 mg | ORAL_TABLET | Freq: Four times a day (QID) | ORAL | Status: DC
  Administered 2022-03-29 – 2022-03-30 (×2): 1000 mg via ORAL
  Filled 2022-03-29 (×2): qty 2

## 2022-03-29 MED ORDER — LIDOCAINE HCL (CARDIAC) PF 100 MG/5ML IV SOSY
PREFILLED_SYRINGE | INTRAVENOUS | Status: DC | PRN
  Administered 2022-03-29: 60 mg via INTRAVENOUS

## 2022-03-29 MED ORDER — ACETAMINOPHEN 500 MG PO TABS
ORAL_TABLET | ORAL | Status: AC
  Filled 2022-03-29: qty 2

## 2022-03-29 MED ORDER — DEXAMETHASONE SODIUM PHOSPHATE 4 MG/ML IJ SOLN
INTRAMUSCULAR | Status: DC | PRN
  Administered 2022-03-29: 5 mg via INTRAVENOUS

## 2022-03-29 MED ORDER — ONDANSETRON HCL 4 MG/2ML IJ SOLN: 4.0000 mg | Freq: Once | INTRAMUSCULAR | Status: DC | PRN

## 2022-03-29 MED ORDER — DONEPEZIL HCL 10 MG PO TABS
10.0000 mg | ORAL_TABLET | Freq: Every day | ORAL | Status: DC
  Filled 2022-03-29: qty 1

## 2022-03-29 MED ORDER — TRAMADOL HCL 50 MG PO TABS: 50.0000 mg | ORAL_TABLET | Freq: Four times a day (QID) | ORAL | Status: DC | PRN

## 2022-03-29 MED ORDER — ONDANSETRON HCL 4 MG/2ML IJ SOLN
INTRAMUSCULAR | Status: DC | PRN
  Administered 2022-03-29: 4 mg via INTRAVENOUS

## 2022-03-29 MED ORDER — FENTANYL CITRATE (PF) 100 MCG/2ML IJ SOLN
INTRAMUSCULAR | Status: DC | PRN
  Administered 2022-03-29: 50 ug via INTRAVENOUS

## 2022-03-29 MED ORDER — METHOCARBAMOL 500 MG PO TABS: 500.0000 mg | ORAL_TABLET | Freq: Four times a day (QID) | ORAL | Status: DC | PRN

## 2022-03-29 MED ORDER — ONDANSETRON HCL 4 MG/2ML IJ SOLN: 4.0000 mg | Freq: Four times a day (QID) | INTRAMUSCULAR | Status: DC | PRN

## 2022-03-29 MED ORDER — METOPROLOL TARTRATE 25 MG PO TABS
ORAL_TABLET | ORAL | Status: AC
  Filled 2022-03-29: qty 4

## 2022-03-29 MED ORDER — FENTANYL CITRATE (PF) 100 MCG/2ML IJ SOLN
INTRAMUSCULAR | Status: AC
  Filled 2022-03-29: qty 2

## 2022-03-29 MED ORDER — ACETAMINOPHEN 325 MG PO TABS: 325.0000 mg | ORAL_TABLET | ORAL | Status: DC | PRN

## 2022-03-29 MED ORDER — BUPIVACAINE-EPINEPHRINE (PF) 0.5% -1:200000 IJ SOLN
INTRAMUSCULAR | Status: AC
  Filled 2022-03-29: qty 30

## 2022-03-29 MED ORDER — CHLORHEXIDINE GLUCONATE CLOTH 2 % EX PADS: 6.0000 | MEDICATED_PAD | Freq: Once | CUTANEOUS | Status: DC

## 2022-03-29 MED ORDER — SODIUM CHLORIDE 0.9 % IV SOLN: INTRAVENOUS | Status: DC

## 2022-03-29 MED ORDER — EPHEDRINE SULFATE (PRESSORS) 50 MG/ML IJ SOLN
INTRAMUSCULAR | Status: DC | PRN
  Administered 2022-03-29: 15 mg via INTRAVENOUS
  Administered 2022-03-29: 10 mg via INTRAVENOUS

## 2022-03-29 MED ORDER — HYDROMORPHONE HCL 1 MG/ML IJ SOLN: 0.5000 mg | INTRAMUSCULAR | Status: DC | PRN

## 2022-03-29 MED ORDER — ACETAMINOPHEN 160 MG/5ML PO SOLN: 325.0000 mg | ORAL | Status: DC | PRN

## 2022-03-29 MED ORDER — LACTATED RINGERS IV SOLN: INTRAVENOUS | Status: DC

## 2022-03-29 MED ORDER — AMLODIPINE BESYLATE 10 MG PO TABS
10.0000 mg | ORAL_TABLET | Freq: Every day | ORAL | Status: DC
  Filled 2022-03-29: qty 1

## 2022-03-29 MED ORDER — DIPHENHYDRAMINE HCL 12.5 MG/5ML PO ELIX: 12.5000 mg | ORAL_SOLUTION | Freq: Four times a day (QID) | ORAL | Status: DC | PRN

## 2022-03-29 MED ORDER — TRAMADOL HCL 50 MG PO TABS: 50.0000 mg | ORAL_TABLET | Freq: Four times a day (QID) | ORAL | 0 refills | Status: DC | PRN

## 2022-03-29 MED ORDER — FENTANYL CITRATE (PF) 100 MCG/2ML IJ SOLN: 25.0000 ug | INTRAMUSCULAR | Status: DC | PRN

## 2022-03-29 MED ORDER — METOPROLOL SUCCINATE ER 100 MG PO TB24
100.0000 mg | ORAL_TABLET | Freq: Every day | ORAL | Status: DC
  Administered 2022-03-29: 100 mg via ORAL
  Filled 2022-03-29: qty 1

## 2022-03-29 MED ORDER — OXYCODONE HCL 5 MG/5ML PO SOLN: 5.0000 mg | Freq: Once | ORAL | Status: DC | PRN

## 2022-03-29 MED ORDER — CEFAZOLIN SODIUM-DEXTROSE 2-4 GM/100ML-% IV SOLN
INTRAVENOUS | Status: AC
  Filled 2022-03-29: qty 100

## 2022-03-29 MED ORDER — 0.9 % SODIUM CHLORIDE (POUR BTL) OPTIME
TOPICAL | Status: DC | PRN
  Administered 2022-03-29: 300 mL

## 2022-03-29 MED ORDER — ENOXAPARIN SODIUM 40 MG/0.4ML IJ SOSY
40.0000 mg | PREFILLED_SYRINGE | INTRAMUSCULAR | Status: DC
  Administered 2022-03-30: 40 mg via SUBCUTANEOUS
  Filled 2022-03-29: qty 0.4

## 2022-03-29 MED ORDER — ACETAMINOPHEN 500 MG PO TABS
1000.0000 mg | ORAL_TABLET | ORAL | Status: AC
  Administered 2022-03-29: 1000 mg via ORAL

## 2022-03-29 MED ORDER — CEFAZOLIN SODIUM-DEXTROSE 2-4 GM/100ML-% IV SOLN
2.0000 g | INTRAVENOUS | Status: AC
  Administered 2022-03-29: 2 g via INTRAVENOUS

## 2022-03-29 MED ORDER — MEMANTINE HCL 10 MG PO TABS
10.0000 mg | ORAL_TABLET | Freq: Two times a day (BID) | ORAL | Status: DC
  Filled 2022-03-29: qty 1

## 2022-03-29 MED ORDER — BUPIVACAINE-EPINEPHRINE 0.5% -1:200000 IJ SOLN
INTRAMUSCULAR | Status: DC | PRN
  Administered 2022-03-29: 10 mL

## 2022-03-29 SURGICAL SUPPLY — 57 items
ADH SKN CLS APL DERMABOND .7 (GAUZE/BANDAGES/DRESSINGS) ×1
APL PRP STRL LF DISP 70% ISPRP (MISCELLANEOUS) ×1
APPLIER CLIP 9.375 MED OPEN (MISCELLANEOUS) ×2
APR CLP MED 9.3 20 MLT OPN (MISCELLANEOUS) ×1
BIOPATCH RED 1 DISK 7.0 (GAUZE/BANDAGES/DRESSINGS) ×1 IMPLANT
BLADE CLIPPER SURG (BLADE) IMPLANT
BLADE SURG 15 STRL LF DISP TIS (BLADE) ×1 IMPLANT
BLADE SURG 15 STRL SS (BLADE) ×2
CANISTER SUCT 1200ML W/VALVE (MISCELLANEOUS) ×2 IMPLANT
CHLORAPREP W/TINT 26 (MISCELLANEOUS) ×2 IMPLANT
CLIP APPLIE 9.375 MED OPEN (MISCELLANEOUS) ×1 IMPLANT
COVER BACK TABLE 60X90IN (DRAPES) ×2 IMPLANT
COVER MAYO STAND STRL (DRAPES) ×2 IMPLANT
DERMABOND ADVANCED (GAUZE/BANDAGES/DRESSINGS) ×1
DERMABOND ADVANCED .7 DNX12 (GAUZE/BANDAGES/DRESSINGS) ×1 IMPLANT
DRAIN CHANNEL 19F RND (DRAIN) ×2 IMPLANT
DRAPE LAPAROSCOPIC ABDOMINAL (DRAPES) ×2 IMPLANT
DRAPE UTILITY XL STRL (DRAPES) ×2 IMPLANT
DRSG TEGADERM 2-3/8X2-3/4 SM (GAUZE/BANDAGES/DRESSINGS) ×1 IMPLANT
ELECT REM PT RETURN 9FT ADLT (ELECTROSURGICAL) ×2
ELECTRODE REM PT RTRN 9FT ADLT (ELECTROSURGICAL) ×1 IMPLANT
EVACUATOR SILICONE 100CC (DRAIN) ×2 IMPLANT
GAUZE SPONGE 4X4 12PLY STRL LF (GAUZE/BANDAGES/DRESSINGS) IMPLANT
GLOVE BIO SURGEON STRL SZ 6.5 (GLOVE) ×1 IMPLANT
GLOVE BIOGEL PI IND STRL 6.5 (GLOVE) IMPLANT
GLOVE BIOGEL PI INDICATOR 6.5 (GLOVE) ×2
GLOVE ECLIPSE 6.5 STRL STRAW (GLOVE) ×1 IMPLANT
GLOVE SURG SIGNA 7.5 PF LTX (GLOVE) ×2 IMPLANT
GOWN STRL REUS W/ TWL LRG LVL3 (GOWN DISPOSABLE) ×1 IMPLANT
GOWN STRL REUS W/ TWL XL LVL3 (GOWN DISPOSABLE) ×1 IMPLANT
GOWN STRL REUS W/TWL LRG LVL3 (GOWN DISPOSABLE) ×2
GOWN STRL REUS W/TWL XL LVL3 (GOWN DISPOSABLE) ×2
HEMOSTAT SURGICEL 2X14 (HEMOSTASIS) IMPLANT
NDL HYPO 25X1 1.5 SAFETY (NEEDLE) ×1 IMPLANT
NDL SAFETY ECLIPSE 18X1.5 (NEEDLE) ×1 IMPLANT
NEEDLE HYPO 18GX1.5 SHARP (NEEDLE) ×2
NEEDLE HYPO 25X1 1.5 SAFETY (NEEDLE) ×2 IMPLANT
NS IRRIG 1000ML POUR BTL (IV SOLUTION) ×2 IMPLANT
PACK BASIN DAY SURGERY FS (CUSTOM PROCEDURE TRAY) ×2 IMPLANT
PENCIL SMOKE EVACUATOR (MISCELLANEOUS) ×2 IMPLANT
PIN SAFETY STERILE (MISCELLANEOUS) ×2 IMPLANT
SLEEVE SCD COMPRESS KNEE MED (STOCKING) ×2 IMPLANT
SPIKE FLUID TRANSFER (MISCELLANEOUS) IMPLANT
SPONGE T-LAP 18X18 ~~LOC~~+RFID (SPONGE) ×2 IMPLANT
SUT ETHILON 3 0 PS 1 (SUTURE) ×2 IMPLANT
SUT MNCRL AB 4-0 PS2 18 (SUTURE) ×5 IMPLANT
SUT SILK 3-0 (SUTURE) ×2
SUT SILK 3-0 RB1 30XBRD (SUTURE) ×1
SUT VIC AB 3-0 SH 27 (SUTURE) ×4
SUT VIC AB 3-0 SH 27X BRD (SUTURE) ×2 IMPLANT
SUT VICRYL 3-0 CR8 SH (SUTURE) ×2 IMPLANT
SUTURE SILK 3-0 RB1 30XBRD (SUTURE) IMPLANT
SYR BULB EAR ULCER 3OZ GRN STR (SYRINGE) ×2 IMPLANT
SYR CONTROL 10ML LL (SYRINGE) ×2 IMPLANT
TOWEL GREEN STERILE FF (TOWEL DISPOSABLE) ×2 IMPLANT
TUBE CONNECTING 20X1/4 (TUBING) ×2 IMPLANT
YANKAUER SUCT BULB TIP NO VENT (SUCTIONS) ×2 IMPLANT

## 2022-03-29 NOTE — Transfer of Care (Signed)
Immediate Anesthesia Transfer of Care Note  Patient: Natasha Cobb  Procedure(s) Performed: LEFT MASTECTOMY (Left: Breast)  Patient Location: PACU  Anesthesia Type:General  Level of Consciousness: sedated  Airway & Oxygen Therapy: Patient Spontanous Breathing and Patient connected to face mask oxygen  Post-op Assessment: Report given to RN and Post -op Vital signs reviewed and stable  Post vital signs: Reviewed and stable  Last Vitals:  Vitals Value Taken Time  BP    Temp    Pulse    Resp    SpO2      Last Pain:  Vitals:   03/29/22 1043  TempSrc: Oral  PainSc: 0-No pain         Complications: No notable events documented.

## 2022-03-29 NOTE — Anesthesia Procedure Notes (Signed)
Procedure Name: LMA Insertion Date/Time: 03/29/2022 12:05 PM  Performed by: Willa Frater, CRNAPre-anesthesia Checklist: Patient identified, Emergency Drugs available, Suction available and Patient being monitored Patient Re-evaluated:Patient Re-evaluated prior to induction Oxygen Delivery Method: Circle system utilized Preoxygenation: Pre-oxygenation with 100% oxygen Induction Type: IV induction Ventilation: Mask ventilation without difficulty LMA: LMA inserted LMA Size: 3.0 Number of attempts: 1 Airway Equipment and Method: Bite block Placement Confirmation: positive ETCO2 Tube secured with: Tape Dental Injury: Teeth and Oropharynx as per pre-operative assessment

## 2022-03-29 NOTE — Op Note (Signed)
LEFT MASTECTOMY  Procedure Note  Natasha Cobb 03/29/2022   Pre-op Diagnosis: LEFT BREAST DUCTAL CARCINOMA IN SITU     Post-op Diagnosis: SAME  Procedure(s): LEFT TOTAL MASTECTOMY  Surgeon(s): Coralie Keens, MD  Assist: Lucita Ferrara, PA  Anesthesia: General  Staff:  Circulator: Merwyn Katos, RN Scrub Person: Izora Ribas, RN  Estimated Blood Loss: Minimal               Specimens: sent to path  Indications: This is a 71 year old female with progressing dementia who presents with ductal carcinoma in situ of the left breast.  She has calcifications measuring greater than 7 cm.  She has had previous DCIS of the right breast and had a lumpectomy and radiation therapy.  Because of the size of this area decision has been made to proceed with a mastectomy  Procedure: The patient was brought to operating identifies correct patient.  She is placed upon the operating table and general anesthesia was induced.  Her left breast and chest were then prepped and draped in usual sterile fashion.  Using a scalpel made elliptical incision transversely across the chest incorporating the nipple areolar complex.  I then dissected down into the breast tissue with the electrocautery.  I dissected the superior skin flap first staying just underneath the skin and dermis going superiorly toward the level of the clavicle and then down to the chest wall.  I dissected medial to lateral going toward the axilla.  I then dissected the inferior skin flap with the cautery staying just under the skin and dermis going down to the inframammary ridge again dissecting medial laterally.  I then took the incision circumferentially down to the chest wall.  I next dissected the breast tissue off the pectoralis fascia medial to lateral with the electrocautery.  Several perforating vessels were controlled with electrocautery.  I then reached the level of the lateral edge of the pectoralis muscles and axilla and  completed the total mastectomy with the cautery.  1 bleeding area was controlled with surgical clips and the cautery.  The specimen was marked laterally with a suture.  It was then sent to pathology for evaluation.  We then irrigated skin flaps and chest wall with saline.  Hemostasis appeared to be achieved.  I made a separate skin incision and placed a 19 [place] Blake drain to the wound.  We then closed the subtenons tissue with interrupted 3-0 Vicryl sutures and closed the skin with a running 4-0 Monocryl.  The drain was secured in place with a 3-0 nylon suture.  Dermabond, dressings, and a breast binder were then applied.  The drain was placed to bulb suction.  The patient tolerated the procedure well.  All the counts were correct at the end of the procedure.  The patient was then extubated in the operating room and taken in stable condition to the recovery room.          Coralie Keens   Date: 03/29/2022  Time: 12:57 PM

## 2022-03-29 NOTE — Anesthesia Postprocedure Evaluation (Signed)
Anesthesia Post Note  Patient: Natasha Cobb  Procedure(s) Performed: LEFT MASTECTOMY (Left: Breast)     Patient location during evaluation: PACU Anesthesia Type: General Level of consciousness: awake and alert Pain management: pain level controlled Vital Signs Assessment: post-procedure vital signs reviewed and stable Respiratory status: spontaneous breathing, nonlabored ventilation, respiratory function stable and patient connected to nasal cannula oxygen Cardiovascular status: blood pressure returned to baseline and stable Postop Assessment: no apparent nausea or vomiting Anesthetic complications: no   No notable events documented.  Last Vitals:  Vitals:   03/29/22 1743 03/29/22 2000  BP: (!) 146/73   Pulse: 60 (!) 59  Resp: 16   Temp:    SpO2: 100% 100%    Last Pain:  Vitals:   03/29/22 2000  TempSrc:   PainSc: 0-No pain                 Hiliana Eilts

## 2022-03-29 NOTE — Interval H&P Note (Signed)
History and Physical Interval Note:no change in H and P  03/29/2022 11:37 AM  Natasha Cobb  has presented today for surgery, with the diagnosis of LEFT BREAST DUCTAL CARCINOMA IN SITU.  The various methods of treatment have been discussed with the patient and family. After consideration of risks, benefits and other options for treatment, the patient has consented to  Procedure(s) with comments: LEFT MASTECTOMY (Left) - LMA as a surgical intervention.  The patient's history has been reviewed, patient examined, no change in status, stable for surgery.  I have reviewed the patient's chart and labs.  Questions were answered to the patient's satisfaction.     Coralie Keens

## 2022-03-29 NOTE — Anesthesia Preprocedure Evaluation (Addendum)
Anesthesia Evaluation  Patient identified by MRN, date of birth, ID band Patient awake    Reviewed: Allergy & Precautions, NPO status , Patient's Chart, lab work & pertinent test results  Airway Mallampati: II  TM Distance: >3 FB Neck ROM: Full    Dental no notable dental hx. (+) Poor Dentition, Chipped, Missing, Dental Advisory Given   Pulmonary neg pulmonary ROS,    Pulmonary exam normal breath sounds clear to auscultation       Cardiovascular hypertension, Pt. on medications and Pt. on home beta blockers Normal cardiovascular exam Rhythm:Regular Rate:Normal     Neuro/Psych PSYCHIATRIC DISORDERS Dementia negative neurological ROS     GI/Hepatic negative GI ROS, Neg liver ROS,   Endo/Other  negative endocrine ROS  Renal/GU negative Renal ROS  negative genitourinary   Musculoskeletal negative musculoskeletal ROS (+)   Abdominal   Peds negative pediatric ROS (+)  Hematology negative hematology ROS (+)   Anesthesia Other Findings   Reproductive/Obstetrics negative OB ROS                            Anesthesia Physical  Anesthesia Plan  ASA: 3  Anesthesia Plan: General   Post-op Pain Management:    Induction: Intravenous  PONV Risk Score and Plan: 3 and Ondansetron, Dexamethasone, Midazolam and Treatment may vary due to age or medical condition  Airway Management Planned: LMA  Additional Equipment: None  Intra-op Plan:   Post-operative Plan: Extubation in OR  Informed Consent: I have reviewed the patients History and Physical, chart, labs and discussed the procedure including the risks, benefits and alternatives for the proposed anesthesia with the patient or authorized representative who has indicated his/her understanding and acceptance.     Dental advisory given  Plan Discussed with: CRNA and Anesthesiologist  Anesthesia Plan Comments:         Anesthesia Quick  Evaluation

## 2022-03-29 NOTE — Discharge Instructions (Signed)
[facility] [phone]  MASTECTOMY: POST OP INSTRUCTIONS  Always review your discharge instruction sheet given to you by the facility where your surgery was performed. IF YOU HAVE DISABILITY OR FAMILY LEAVE FORMS, YOU MUST BRING THEM TO THE OFFICE FOR PROCESSING.   DO NOT GIVE THEM TO YOUR DOCTOR. A prescription for pain medication may be given to you upon discharge.  Take your pain medication as prescribed, if needed.  If narcotic pain medicine is not needed, then you may take acetaminophen (Tylenol) or ibuprofen (Advil) as needed. Take your usually prescribed medications unless otherwise directed. If you need a refill on your pain medication, please contact your pharmacy.  They will contact our office to request authorization.  Prescriptions will not be filled after 5pm or on week-ends. You should follow a light diet the first few days after arrival home, such as soup and crackers, etc.  Resume your normal diet the day after surgery. Most patients will experience some swelling and bruising on the chest and underarm.  Ice packs will help.  Swelling and bruising can take several days to resolve.  It is common to experience some constipation if taking pain medication after surgery.  Increasing fluid intake and taking a stool softener (such as Colace) will usually help or prevent this problem from occurring.  A mild laxative (Milk of Magnesia or Miralax) should be taken according to package instructions if there are no bowel movements after 48 hours. Unless discharge instructions indicate otherwise, leave your bandage dry and in place until your next appointment in 3-5 days.  You may take a limited sponge bath.  No tube baths or showers until the drains are removed.  You may have steri-strips (small skin tapes) in place directly over the incision.  These strips should be left on the skin for 7-10 days.  If your surgeon used skin glue on the incision, you may shower in 24 hours.   The glue will flake off over the next 2-3 weeks.  Any sutures or staples will be removed at the office during your follow-up visit. DRAINS:  If you have drains in place, it is important to keep a list of the amount of drainage produced each day in your drains.  Before leaving the hospital, you should be instructed on drain care.  Call our office if you have any questions about your drains. ACTIVITIES:  You may resume regular (light) daily activities beginning the next day--such as daily self-care, walking, climbing stairs--gradually increasing activities as tolerated.  You may have sexual intercourse when it is comfortable.  Refrain from any heavy lifting or straining until approved by your doctor. You may drive when you are no longer taking prescription pain medication, you can comfortably wear a seatbelt, and you can safely maneuver your car and apply brakes. RETURN TO WORK:  __________________________________________________________ You should see your doctor in the office for a follow-up appointment approximately 3-5 days after your surgery.  Your doctor's nurse will typically make your follow-up appointment when she calls you with your pathology report.  Expect your pathology report 2-3 business days after your surgery.  You may call to check if you do not hear from us after three days.   OTHER INSTRUCTIONS: ______________________________________________________________________________________________ ____________________________________________________________________________________________ WHEN TO CALL YOUR DOCTOR: Fever over 101.0 Nausea and/or vomiting Extreme swelling or bruising Continued bleeding from incision. Increased pain, redness, or drainage from the incision. The clinic staff is available to answer your questions during regular business hours.  Please don't hesitate   to call and ask to speak to one of the nurses for clinical concerns.  If you have a medical emergency, go to the  nearest emergency room or call 911.  A surgeon from [facility] is always on call at the hospital. [address], [address], [place], [place]  [address] ? [address] [address], [place], [place]   [address] [phone] ? [phone] ? FAX [phone] Web site: [email or web address]  

## 2022-03-30 ENCOUNTER — Encounter (HOSPITAL_BASED_OUTPATIENT_CLINIC_OR_DEPARTMENT_OTHER): Payer: Self-pay | Admitting: Surgery

## 2022-03-30 DIAGNOSIS — Z79899 Other long term (current) drug therapy: Secondary | ICD-10-CM | POA: Diagnosis not present

## 2022-03-30 DIAGNOSIS — D0512 Intraductal carcinoma in situ of left breast: Secondary | ICD-10-CM | POA: Diagnosis not present

## 2022-03-30 DIAGNOSIS — G309 Alzheimer's disease, unspecified: Secondary | ICD-10-CM | POA: Diagnosis not present

## 2022-03-30 DIAGNOSIS — I10 Essential (primary) hypertension: Secondary | ICD-10-CM | POA: Diagnosis not present

## 2022-03-30 NOTE — Discharge Instr - Supplementary Instructions (Addendum)
About my Jackson-Pratt Bulb Drain  What is a Jackson-Pratt bulb? A Jackson-Pratt is a soft, round device used to collect drainage. It is connected to a long, thin drainage catheter, which is held in place by one or two small stiches near your surgical incision site. When the bulb is squeezed, it forms a vacuum, forcing the drainage to empty into the bulb.  Emptying the Jackson-Pratt bulb- To empty the bulb: 1. Release the plug on the top of the bulb. 2. Pour the bulb's contents into a measuring container which your nurse will provide. 3. Record the time emptied and amount of drainage. Empty the drain(s) as often as your     doctor or nurse recommends.  Date                  Time                    Amount (Drain 1)                  _____________________________________________________________________  _____________________________________________________________________  _____________________________________________________________________  _____________________________________________________________________  _____________________________________________________________________  _____________________________________________________________________  _____________________________________________________________________  _____________________________________________________________________  Squeezing the Jackson-Pratt Bulb- To squeeze the bulb: 1. Make sure the plug at the top of the bulb is open. 2. Squeeze the bulb tightly in your fist. You will hear air squeezing from the bulb. 3. Replace the plug while the bulb is squeezed. 4. Use a safety pin to attach the bulb to your clothing. This will keep the catheter from     pulling at the bulb insertion site.  When to call your doctor- Call your doctor if: Drain site becomes red, swollen or hot. You have a fever greater than 101 degrees F. There is oozing at the drain site. Drain falls out (apply a guaze bandage over the drain hole  and secure it with tape). Drainage increases daily not related to activity patterns. (You will usually have more drainage when you are active than when you are resting.) Drainage has a bad odor.

## 2022-03-30 NOTE — Discharge Summary (Signed)
Physician Discharge Summary  Patient ID: Natasha Cobb MRN: [id number] DOB/AGE: 06-Jan-1951 71 y.o.  Admit date: 03/29/2022 Discharge date: 03/30/2022  Admission Diagnoses:  Discharge Diagnoses:  Principal Problem:   S/P left mastectomy   Discharged Condition: good  Hospital Course: uneventful post op recovery.  Discharged home POD#1  Consults: None  Significant Diagnostic Studies:   Treatments: surgery: left total mastectomy  Discharge Exam: Blood pressure (!) 159/75, pulse (!) 50, temperature 97.7 F (36.5 C), resp. rate 18, height '5\' 4"'$  (1.626 m), weight 57 kg, SpO2 100 %. General appearance: alert, cooperative, and no distress Resp: clear to auscultation bilaterally Cardio: regular rate and rhythm, S1, S2 normal, no murmur, click, rub or gallop Incision/Wound:drain sersang, flaps viable left chest  Disposition: Discharge disposition: 01-Home or Self Care       Discharge Instructions     Diet - low sodium heart healthy   Complete by: As directed    Increase activity slowly   Complete by: As directed       Allergies as of 03/30/2022       Reactions   Ace Inhibitors    Other reaction(s): Cough        Medication List     TAKE these medications    amLODipine 10 MG tablet Commonly known as: NORVASC Take 10 mg by mouth daily.   anastrozole 1 MG tablet Commonly known as: ARIMIDEX TAKE 1 TABLET BY MOUTH EVERY DAY   atorvastatin 40 MG tablet Commonly known as: LIPITOR Take 40 mg by mouth daily.   donepezil 10 MG tablet Commonly known as: ARICEPT Take 1 tablet daily   memantine 10 MG tablet Commonly known as: NAMENDA TAKE 1 TABLET (10 MG AT NIGHT) FOR 2 WEEKS, THEN INCREASE TO 1 TABLET (10 MG) TWICE A DAY   metoprolol succinate 100 MG 24 hr tablet Commonly known as: TOPROL-XL Take 100 mg by mouth daily.   traMADol 50 MG tablet Commonly known as: ULTRAM Take 1 tablet (50 mg total) by mouth every 6 (six) hours as needed for moderate pain.         Follow-up Information     Coralie Keens, MD. Schedule an appointment as soon as possible for a visit in 2 week(s).   Specialty: General Surgery Contact information: [place] [address] [phone]                 Signed: Coralie Keens 03/30/2022, 6:08 AM

## 2022-03-31 LAB — SURGICAL PATHOLOGY

## 2022-04-06 ENCOUNTER — Encounter: Payer: Self-pay | Admitting: *Deleted

## 2022-04-11 ENCOUNTER — Encounter: Payer: Self-pay | Admitting: Physician Assistant

## 2022-04-11 ENCOUNTER — Ambulatory Visit: Payer: Medicare HMO | Admitting: Physician Assistant

## 2022-04-11 VITALS — BP 165/88 | HR 100 | Resp 18 | Ht 66.0 in | Wt 124.0 lb

## 2022-04-11 DIAGNOSIS — G300 Alzheimer's disease with early onset: Secondary | ICD-10-CM | POA: Diagnosis not present

## 2022-04-11 DIAGNOSIS — F0280 Dementia in other diseases classified elsewhere without behavioral disturbance: Secondary | ICD-10-CM
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
## 2022-04-11 NOTE — Patient Instructions (Signed)
Continue Donepezil '10mg'$  daily  Continue Memantine '10mg'$  tablets twice daily.    3. Follow-up in 6 months, call for any changes   FALL PRECAUTIONS: Be cautious when walking. Scan the area for obstacles that may increase the risk of trips and falls. When getting up in the mornings, sit up at the edge of the bed for a few minutes before getting out of bed. Consider elevating the bed at the head end to avoid drop of blood pressure when getting up. Walk always in a well-lit room (use night lights in the walls). Avoid area rugs or power cords from appliances in the middle of the walkways. Use a walker or a cane if necessary and consider physical therapy for balance exercise. Get your eyesight checked regularly.  FINANCIAL OVERSIGHT: Supervision, especially oversight when making financial decisions or transactions is also recommended.  HOME SAFETY: Consider the safety of the kitchen when operating appliances like stoves, microwave oven, and blender. Consider having supervision and share cooking responsibilities until no longer able to participate in those. Accidents with firearms and other hazards in the house should be identified and addressed as well.  ABILITY TO BE LEFT ALONE: If patient is unable to contact 911 operator, consider using LifeLine, or when the need is there, arrange for someone to stay with patients. Smoking is a fire hazard, consider supervision or cessation. Risk of wandering should be assessed by caregiver and if detected at any point, supervision and safe proof recommendations should be instituted.  MEDICATION SUPERVISION: Inability to self-administer medication needs to be constantly addressed. Implement a mechanism to ensure safe administration of the medications.  RECOMMENDATIONS FOR ALL PATIENTS WITH MEMORY PROBLEMS: 1. Continue to exercise (Recommend 30 minutes of walking everyday, or 3 hours every week) 2. Increase social interactions - continue going to [place] and enjoy social  gatherings with friends and family 3. Eat healthy, avoid fried foods and eat more fruits and vegetables 4. Maintain adequate blood pressure, blood sugar, and blood cholesterol level. Reducing the risk of stroke and cardiovascular disease also helps promoting better memory. 5. Avoid stressful situations. Live a simple life and avoid aggravations. Organize your time and prepare for the next day in anticipation. 6. Sleep well, avoid any interruptions of sleep and avoid any distractions in the bedroom that may interfere with adequate sleep quality 7. Avoid sugar, avoid sweets as there is a strong link between excessive sugar intake, diabetes, and cognitive impairment The Mediterranean diet has been shown to help patients reduce the risk of progressive memory disorders and reduces cardiovascular risk. This includes eating fish, eat fruits and green leafy vegetables, nuts like almonds and hazelnuts, walnuts, and also use olive oil. Avoid fast foods and fried foods as much as possible. Avoid sweets and sugar as sugar use has been linked to worsening of memory function.  There is always a concern of gradual progression of memory problems. If this is the case, then we may need to adjust level of care according to patient needs. Support, both to the patient and caregiver, should then be put into place.

## 2022-04-13 ENCOUNTER — Ambulatory Visit: Payer: Medicare HMO | Admitting: Neurology

## 2022-04-26 ENCOUNTER — Ambulatory Visit: Payer: Medicare HMO | Admitting: Hematology and Oncology

## 2022-04-27 ENCOUNTER — Encounter: Payer: Self-pay | Admitting: *Deleted

## 2022-04-27 ENCOUNTER — Other Ambulatory Visit: Payer: Self-pay | Admitting: *Deleted

## 2022-04-27 ENCOUNTER — Other Ambulatory Visit: Payer: Self-pay

## 2022-04-27 ENCOUNTER — Other Ambulatory Visit: Payer: Self-pay | Admitting: Hematology and Oncology

## 2022-04-27 ENCOUNTER — Inpatient Hospital Stay: Payer: Medicare HMO | Attending: Hematology and Oncology | Admitting: Hematology and Oncology

## 2022-04-27 VITALS — BP 150/110 | HR 108 | Temp 97.7°F | Resp 17 | Ht 66.0 in | Wt 122.1 lb

## 2022-04-27 DIAGNOSIS — Z170 Estrogen receptor positive status [ER+]: Secondary | ICD-10-CM | POA: Insufficient documentation

## 2022-04-27 DIAGNOSIS — M8580 Other specified disorders of bone density and structure, unspecified site: Secondary | ICD-10-CM | POA: Insufficient documentation

## 2022-04-27 DIAGNOSIS — D0511 Intraductal carcinoma in situ of right breast: Secondary | ICD-10-CM | POA: Insufficient documentation

## 2022-04-27 DIAGNOSIS — Z8249 Family history of ischemic heart disease and other diseases of the circulatory system: Secondary | ICD-10-CM | POA: Diagnosis not present

## 2022-04-27 DIAGNOSIS — I10 Essential (primary) hypertension: Secondary | ICD-10-CM | POA: Insufficient documentation

## 2022-04-27 DIAGNOSIS — Z9012 Acquired absence of left breast and nipple: Secondary | ICD-10-CM | POA: Diagnosis not present

## 2022-04-27 DIAGNOSIS — Z79899 Other long term (current) drug therapy: Secondary | ICD-10-CM | POA: Insufficient documentation

## 2022-04-27 DIAGNOSIS — Z79811 Long term (current) use of aromatase inhibitors: Secondary | ICD-10-CM | POA: Diagnosis not present

## 2022-04-27 NOTE — Progress Notes (Signed)
SURVIVORSHIP VISIT:    BRIEF ONCOLOGIC HISTORY:  Oncology History  Ductal carcinoma in situ of right breast  12/14/2020 Initial Diagnosis   Right breast diagnostic mammogram done on December 14, 2020 showed a 0.5 cm group of round calcifications in the right breast at 930, 9 cm from the nipple.  There is a second stable group of 5 round calcifications in the right breast upper outer aspect middle depth.  The 0.5 cm grouped calcifications in the right breast at 9:00 middle depth are increasing in number and stereotactic biopsy is recommended to exclude high risk lesion or DCIS.   Right medial breast core biopsy on December 29, 2020, 9:00, 9 cm from nipple showed sclerotic ductal papilloma with usual ductal epithelial hyperplasia and calcifications.     04/06/2021 Surgery   Right breast lumpectomy showed ductal carcinoma in situ with calcifications, margins not involved, measuring 1.5 cm, low-grade, all margins negative, prognostic showed ER +90%, strong staining, PR positive, 95%, strong staining.    04/06/2021 Cancer Staging   Staging form: Breast, AJCC 8th Edition - Pathologic stage from 04/06/2021: Stage 0 (pTis (DCIS), pN0, cM0, ER+, PR+) - Signed by Gardenia Phlegm, NP on 09/07/2021   05/23/2021 - 06/21/2021 Radiation Therapy   05/23/2021 through 06/21/2021 Site Technique Total Dose (Gy) Dose per Fx (Gy) Completed Fx Beam Energies  Breast, Right: Breast_Rt 3D 40.05/40.05 2.67 15/15 6X  Breast, Right: Breast_Rt_Bst 3D 10/10 2 5/5 6X    07/2021 -  Anti-estrogen oral therapy   Anastrozole daily     INTERVAL HISTORY:   She is here for follow up with her husband.  Since her last visit, she has been healing well.  She has underlying dementia, husband manages all her medications.  He tells me that he has given her all her pills but he cannot quite confirm if she was taking the anastrozole.  She denies any other complaints for me today. No change in breathing or bowel habits.  No new bone  pains. She had her last mammogram in May 2023 at [facility] which showed the calcifications in the left breast. Rest of the pertinent 10 point ROS reviewed and negative.  REVIEW OF SYSTEMS:  Review of Systems  Constitutional:  Positive for appetite change and unexpected weight change (weight has increased). Negative for chills, fatigue and fever.  HENT:   Negative for hearing loss, lump/mass and trouble swallowing.   Eyes:  Negative for eye problems and icterus.  Respiratory:  Negative for chest tightness, cough and shortness of breath.   Cardiovascular:  Negative for chest pain, leg swelling and palpitations.  Gastrointestinal:  Negative for abdominal distention, abdominal pain, constipation, diarrhea, nausea and vomiting.  Endocrine: Negative for hot flashes.  Genitourinary:  Negative for difficulty urinating.   Musculoskeletal:  Negative for arthralgias.  Skin:  Negative for itching and rash.  Neurological:  Negative for dizziness, extremity weakness, headaches and numbness.  Hematological:  Negative for adenopathy. Does not bruise/bleed easily.  Psychiatric/Behavioral:  Negative for depression. The patient is not nervous/anxious.    Breast: Denies any new nodularity, masses, tenderness, nipple changes, or nipple discharge.    ONCOLOGY TREATMENT TEAM:  1. Surgeon:  Dr. Ninfa Linden at [facility] 2. Medical Oncologist: Dr. Chryl Heck  3. Radiation Oncologist: Dr. Sondra Come    PAST MEDICAL/SURGICAL HISTORY:  Past Medical History:  Diagnosis Date   Breast cancer ([place])    Concussion 02/27/2018   Dementia ([place])    History of radiation therapy    Right breast-  05/23/21-06/21/21- Dr. Gery Pray   Hyperlipidemia    Hypertension    Past Surgical History:  Procedure Laterality Date   BREAST LUMPECTOMY WITH RADIOACTIVE SEED LOCALIZATION Right 03/31/2021   Procedure: RIGHT BREAST LUMPECTOMY WITH RADIOACTIVE SEED LOCALIZATION;  Surgeon: Coralie Keens, MD;  Location: [place];  Service: General;  Laterality: Right;   NO PAST SURGERIES     SIMPLE MASTECTOMY WITH AXILLARY SENTINEL NODE BIOPSY Left 03/29/2022   Procedure: LEFT MASTECTOMY;  Surgeon: Coralie Keens, MD;  Location: [place];  Service: General;  Laterality: Left;  LMA     ALLERGIES:  Allergies  Allergen Reactions   Ace Inhibitors     Other reaction(s): Cough   Metoprolol Tartrate Other (See Comments)     CURRENT MEDICATIONS:  Outpatient Encounter Medications as of 04/27/2022  Medication Sig   amLODipine (NORVASC) 10 MG tablet Take 10 mg by mouth daily.   anastrozole (ARIMIDEX) 1 MG tablet TAKE 1 TABLET BY MOUTH EVERY DAY   atorvastatin (LIPITOR) 40 MG tablet Take 40 mg by mouth daily.   donepezil (ARICEPT) 10 MG tablet Take 1 tablet daily   memantine (NAMENDA) 10 MG tablet TAKE 1 TABLET (10 MG AT NIGHT) FOR 2 WEEKS, THEN INCREASE TO 1 TABLET (10 MG) TWICE A DAY   metoprolol succinate (TOPROL-XL) 100 MG 24 hr tablet Take 100 mg by mouth daily.   traMADol (ULTRAM) 50 MG tablet Take 1 tablet (50 mg total) by mouth every 6 (six) hours as needed for moderate pain.   No facility-administered encounter medications on file as of 04/27/2022.     ONCOLOGIC FAMILY HISTORY:  Family History  Problem Relation Age of Onset   Hypertension Other    Dementia Neg Hx      GENETIC COUNSELING/TESTING: Not at this time  SOCIAL HISTORY:  Social History   Socioeconomic History   Marital status: Married    Spouse name: Not on file   Number of children: Not on file   Years of education: 12   Highest education level: High school graduate  Occupational History   Occupation: Retired  Tobacco Use   Smoking status: Never   Smokeless tobacco: Never  Scientific laboratory technician Use: Not on file  Substance and Sexual Activity   Alcohol use: No   Drug use: Never   Sexual activity: Not Currently    Partners: Male    Birth control/protection: Post-menopausal  Other Topics  Concern   Not on file  Social History Narrative   Lives with husband and two dogs      Right handed      Split level home      Highest level of edu- 12th grade   Social Determinants of Health   Financial Resource Strain: Low Risk  (09/08/2021)   Overall Financial Resource Strain (CARDIA)    Difficulty of Paying Living Expenses: Not hard at all  Food Insecurity: No Food Insecurity (09/08/2021)   Hunger Vital Sign    Worried About Running Out of Food in the Last Year: Never true    [place] in the Last Year: Never true  Transportation Needs: No Transportation Needs (09/08/2021)   PRAPARE - Hydrologist (Medical): No    Lack of Transportation (Non-Medical): No  Physical Activity: Insufficiently Active (09/08/2021)   Exercise Vital Sign    Days of Exercise per Week: 1 day    Minutes of Exercise per Session: 60 min  Stress: No Stress Concern Present (09/08/2021)   [place]    Feeling of Stress : Not at all  Social Connections: Moderately Integrated (09/08/2021)   Social Connection and Isolation Panel [NHANES]    Frequency of Communication with Friends and Family: More than three times a week    Frequency of Social Gatherings with Friends and Family: More than three times a week    Attends Religious Services: 1 to 4 times per year    Active Member of [employer] or Organizations: No    Attends Archivist Meetings: Never    Marital Status: Married  Human resources officer Violence: Not At Risk (09/08/2021)   Humiliation, Afraid, Rape, and Kick questionnaire    Fear of Current or Ex-Partner: No    Emotionally Abused: No    Physically Abused: No    Sexually Abused: No     OBSERVATIONS/OBJECTIVE:  BP (!) 150/110 (BP Location: Left Arm, Patient Position: Sitting) Comment: Dr. notified  Pulse (!) 108 Comment: Dr. notified  Temp 97.7 F (36.5 C) (Temporal)   Resp 17   Ht '5\' 6"'$  (1.676  m)   Wt 122 lb 1.6 oz (55.4 kg)   SpO2 100%   BMI 19.71 kg/m   GENERAL: Patient is a well appearing female in no acute distress HEENT:  Sclerae anicteric.   NODES:  No cervical, supraclavicular, or axillary lymphadenopathy palpated.  BREAST EXAM: Status post left mastectomy, surgical scar appears well.  Right breast also normal to inspection and palpation.  No regional adenopathy.  LABORATORY DATA:  None for this visit.  DIAGNOSTIC IMAGING:  None for this visit.     ASSESSMENT AND PLAN:   Ms.. Lovins is a pleasant 71 y.o. female with Stage 0 right breast ductal carcinoma in situ, estrogen and progesterone positive, diagnosed in July 2022, treated with lumpectomy, adjuvant radiation therapy, and antiestrogen therapy with anastrozole beginning in October 2022.    Since her last visit, she is now diagnosed with left breast DCIS with calcifications measuring greater than 7 cm hence had mastectomy and is here to review final pathology.  Left breast mastectomy once again showed isolated scattered foci of biopsy site with clear margins of resection, no evidence of invasive carcinoma, prior prognostic showed ER 100% PR 1%. Since she was on anastrozole at the time of this DCIS, we have recommended Faslodex for antiestrogen therapy.  She may not be a good candidate for tamoxifen given her sedentary lifestyle.  Faslodex supportive plan placed.  She will stop by scheduling today to schedule her Faslodex injections.  She will be due for diagnostic mammogram in May 2024.  I once again encouraged husband to make sure that she takes some vitamin D supplements and calcium as tolerated given her osteopenia.  I do not believe she is a good candidate for bisphosphonates.  Return to clinic in 6 months.  Total time spent : 30 minutes.  *Total Encounter Time as defined by the Centers for Medicare and Medicaid Services includes, in addition to the face-to-face time of a patient visit (documented in the note  above) non-face-to-face time: obtaining and reviewing outside history, ordering and reviewing medications, tests or procedures, care coordination (communications with other health care professionals or caregivers) and documentation in the medical record.

## 2022-04-27 NOTE — Telephone Encounter (Signed)
Per MD request- this RN contacted pt's pharmacy and discontinued anastrazole.

## 2022-05-01 ENCOUNTER — Other Ambulatory Visit: Payer: Self-pay

## 2022-05-01 DIAGNOSIS — D0511 Intraductal carcinoma in situ of right breast: Secondary | ICD-10-CM

## 2022-05-03 ENCOUNTER — Inpatient Hospital Stay: Payer: Medicare HMO | Attending: Hematology and Oncology

## 2022-05-03 ENCOUNTER — Inpatient Hospital Stay: Payer: Medicare HMO

## 2022-05-03 ENCOUNTER — Other Ambulatory Visit: Payer: Self-pay

## 2022-05-03 VITALS — BP 159/86 | HR 55 | Temp 97.8°F | Resp 18

## 2022-05-03 DIAGNOSIS — D0511 Intraductal carcinoma in situ of right breast: Secondary | ICD-10-CM | POA: Insufficient documentation

## 2022-05-03 DIAGNOSIS — Z5111 Encounter for antineoplastic chemotherapy: Secondary | ICD-10-CM | POA: Diagnosis not present

## 2022-05-03 LAB — CMP (CANCER CENTER ONLY)
ALT: 7 U/L (ref 0–44)
AST: 13 U/L — ABNORMAL LOW (ref 15–41)
Albumin: 3.7 g/dL (ref 3.5–5.0)
Alkaline Phosphatase: 57 U/L (ref 38–126)
Anion gap: 6 (ref 5–15)
BUN: 16 mg/dL (ref 8–23)
CO2: 32 mmol/L (ref 22–32)
Calcium: 9.9 mg/dL (ref 8.9–10.3)
Chloride: 104 mmol/L (ref 98–111)
Creatinine: 1.22 mg/dL — ABNORMAL HIGH (ref 0.44–1.00)
GFR, Estimated: 48 mL/min — ABNORMAL LOW (ref >60)
Glucose, Bld: 102 mg/dL — ABNORMAL HIGH (ref 70–99)
Potassium: 3 mmol/L — ABNORMAL LOW (ref 3.5–5.1)
Sodium: 142 mmol/L (ref 135–145)
Total Bilirubin: 1.3 mg/dL — ABNORMAL HIGH (ref 0.3–1.2)
Total Protein: 7.2 g/dL (ref 6.5–8.1)

## 2022-05-03 LAB — CBC WITH DIFFERENTIAL (CANCER CENTER ONLY)
Abs Immature Granulocytes: 0 10*3/uL (ref 0.00–0.07)
Basophils Absolute: 0 10*3/uL (ref 0.0–0.1)
Basophils Relative: 1 %
Eosinophils Absolute: 0.1 10*3/uL (ref 0.0–0.5)
Eosinophils Relative: 3 %
HCT: 38.8 % (ref 36.0–46.0)
Hemoglobin: 12.7 g/dL (ref 12.0–15.0)
Immature Granulocytes: 0 %
Lymphocytes Relative: 31 %
Lymphs Abs: 1.1 10*3/uL (ref 0.7–4.0)
MCH: 27.5 pg (ref 26.0–34.0)
MCHC: 32.7 g/dL (ref 30.0–36.0)
MCV: 84.2 fL (ref 80.0–100.0)
Monocytes Absolute: 0.4 10*3/uL (ref 0.1–1.0)
Monocytes Relative: 11 %
Neutro Abs: 1.9 10*3/uL (ref 1.7–7.7)
Neutrophils Relative %: 54 %
Platelet Count: 185 10*3/uL (ref 150–400)
RBC: 4.61 MIL/uL (ref 3.87–5.11)
RDW: 12.1 % (ref 11.5–15.5)
WBC Count: 3.6 10*3/uL — ABNORMAL LOW (ref 4.0–10.5)
nRBC: 0 % (ref 0.0–0.2)

## 2022-05-03 MED ORDER — FULVESTRANT 250 MG/5ML IM SOSY
500.0000 mg | PREFILLED_SYRINGE | Freq: Once | INTRAMUSCULAR | Status: AC
  Administered 2022-05-03: 500 mg via INTRAMUSCULAR
  Filled 2022-05-03: qty 10

## 2022-05-10 ENCOUNTER — Telehealth: Payer: Self-pay | Admitting: *Deleted

## 2022-05-10 NOTE — Telephone Encounter (Addendum)
-----   Message from Rolland Bimler, RN sent at 05/08/2022  4:59 PM EDT ----- Regarding: Results Val, I called, but no one answered. LVM to call office on Tuesday for lab results and information. Thanks, Sandi  ----- Message ----- From: Benay Pike, MD Sent: 05/08/2022   2:37 PM EDT To: Rolland Bimler, RN  [place]  Please advise potassium rich foods.  Thanks  This RN called pt and again obtained identified VM- detailed message regarding potassium and foods for benefit. This RN's name given for return call.

## 2022-05-16 ENCOUNTER — Other Ambulatory Visit: Payer: Self-pay | Admitting: *Deleted

## 2022-05-16 DIAGNOSIS — D0511 Intraductal carcinoma in situ of right breast: Secondary | ICD-10-CM

## 2022-05-17 ENCOUNTER — Inpatient Hospital Stay: Payer: Medicare HMO

## 2022-05-17 ENCOUNTER — Other Ambulatory Visit: Payer: Self-pay

## 2022-05-17 VITALS — BP 147/79 | HR 57 | Temp 98.6°F | Resp 16

## 2022-05-17 DIAGNOSIS — D0511 Intraductal carcinoma in situ of right breast: Secondary | ICD-10-CM | POA: Diagnosis not present

## 2022-05-17 DIAGNOSIS — Z5111 Encounter for antineoplastic chemotherapy: Secondary | ICD-10-CM | POA: Diagnosis not present

## 2022-05-17 LAB — CBC WITH DIFFERENTIAL (CANCER CENTER ONLY)
Abs Immature Granulocytes: 0.01 10*3/uL (ref 0.00–0.07)
Basophils Absolute: 0 10*3/uL (ref 0.0–0.1)
Basophils Relative: 0 %
Eosinophils Absolute: 0.1 10*3/uL (ref 0.0–0.5)
Eosinophils Relative: 2 %
HCT: 37.3 % (ref 36.0–46.0)
Hemoglobin: 12.2 g/dL (ref 12.0–15.0)
Immature Granulocytes: 0 %
Lymphocytes Relative: 27 %
Lymphs Abs: 0.9 10*3/uL (ref 0.7–4.0)
MCH: 27.6 pg (ref 26.0–34.0)
MCHC: 32.7 g/dL (ref 30.0–36.0)
MCV: 84.4 fL (ref 80.0–100.0)
Monocytes Absolute: 0.4 10*3/uL (ref 0.1–1.0)
Monocytes Relative: 11 %
Neutro Abs: 2 10*3/uL (ref 1.7–7.7)
Neutrophils Relative %: 60 %
Platelet Count: 171 10*3/uL (ref 150–400)
RBC: 4.42 MIL/uL (ref 3.87–5.11)
RDW: 12.5 % (ref 11.5–15.5)
WBC Count: 3.4 10*3/uL — ABNORMAL LOW (ref 4.0–10.5)
nRBC: 0 % (ref 0.0–0.2)

## 2022-05-17 LAB — CMP (CANCER CENTER ONLY)
ALT: 9 U/L (ref 0–44)
AST: 15 U/L (ref 15–41)
Albumin: 3.6 g/dL (ref 3.5–5.0)
Alkaline Phosphatase: 56 U/L (ref 38–126)
Anion gap: 4 — ABNORMAL LOW (ref 5–15)
BUN: 9 mg/dL (ref 8–23)
CO2: 33 mmol/L — ABNORMAL HIGH (ref 22–32)
Calcium: 9.7 mg/dL (ref 8.9–10.3)
Chloride: 107 mmol/L (ref 98–111)
Creatinine: 1.09 mg/dL — ABNORMAL HIGH (ref 0.44–1.00)
GFR, Estimated: 55 mL/min — ABNORMAL LOW (ref >60)
Glucose, Bld: 90 mg/dL (ref 70–99)
Potassium: 3.3 mmol/L — ABNORMAL LOW (ref 3.5–5.1)
Sodium: 144 mmol/L (ref 135–145)
Total Bilirubin: 1.7 mg/dL — ABNORMAL HIGH (ref 0.3–1.2)
Total Protein: 6.6 g/dL (ref 6.5–8.1)

## 2022-05-17 MED ORDER — FULVESTRANT 250 MG/5ML IM SOSY
500.0000 mg | PREFILLED_SYRINGE | Freq: Once | INTRAMUSCULAR | Status: AC
  Administered 2022-05-17: 500 mg via INTRAMUSCULAR
  Filled 2022-05-17: qty 10

## 2022-05-17 NOTE — Patient Instructions (Signed)

## 2022-05-26 DIAGNOSIS — D0512 Intraductal carcinoma in situ of left breast: Secondary | ICD-10-CM | POA: Diagnosis not present

## 2022-05-30 ENCOUNTER — Other Ambulatory Visit: Payer: Self-pay | Admitting: *Deleted

## 2022-05-30 DIAGNOSIS — D0511 Intraductal carcinoma in situ of right breast: Secondary | ICD-10-CM

## 2022-05-31 ENCOUNTER — Inpatient Hospital Stay: Payer: Medicare HMO

## 2022-05-31 ENCOUNTER — Other Ambulatory Visit: Payer: Self-pay

## 2022-05-31 VITALS — BP 166/75 | HR 85 | Temp 98.6°F | Resp 18

## 2022-05-31 DIAGNOSIS — D0511 Intraductal carcinoma in situ of right breast: Secondary | ICD-10-CM

## 2022-05-31 DIAGNOSIS — D0512 Intraductal carcinoma in situ of left breast: Secondary | ICD-10-CM | POA: Diagnosis not present

## 2022-05-31 DIAGNOSIS — Z5111 Encounter for antineoplastic chemotherapy: Secondary | ICD-10-CM | POA: Diagnosis not present

## 2022-05-31 LAB — CMP (CANCER CENTER ONLY)
ALT: 8 U/L (ref 0–44)
AST: 14 U/L — ABNORMAL LOW (ref 15–41)
Albumin: 3.8 g/dL (ref 3.5–5.0)
Alkaline Phosphatase: 53 U/L (ref 38–126)
Anion gap: 4 — ABNORMAL LOW (ref 5–15)
BUN: 8 mg/dL (ref 8–23)
CO2: 30 mmol/L (ref 22–32)
Calcium: 9.7 mg/dL (ref 8.9–10.3)
Chloride: 108 mmol/L (ref 98–111)
Creatinine: 0.98 mg/dL (ref 0.44–1.00)
GFR, Estimated: >60 mL/min (ref >60)
Glucose, Bld: 91 mg/dL (ref 70–99)
Potassium: 3.3 mmol/L — ABNORMAL LOW (ref 3.5–5.1)
Sodium: 142 mmol/L (ref 135–145)
Total Bilirubin: 1.6 mg/dL — ABNORMAL HIGH (ref 0.3–1.2)
Total Protein: 6.4 g/dL — ABNORMAL LOW (ref 6.5–8.1)

## 2022-05-31 LAB — CBC WITH DIFFERENTIAL (CANCER CENTER ONLY)
Abs Immature Granulocytes: 0 10*3/uL (ref 0.00–0.07)
Basophils Absolute: 0 10*3/uL (ref 0.0–0.1)
Basophils Relative: 1 %
Eosinophils Absolute: 0.1 10*3/uL (ref 0.0–0.5)
Eosinophils Relative: 4 %
HCT: 35.6 % — ABNORMAL LOW (ref 36.0–46.0)
Hemoglobin: 11.7 g/dL — ABNORMAL LOW (ref 12.0–15.0)
Immature Granulocytes: 0 %
Lymphocytes Relative: 35 %
Lymphs Abs: 1.2 10*3/uL (ref 0.7–4.0)
MCH: 27.7 pg (ref 26.0–34.0)
MCHC: 32.9 g/dL (ref 30.0–36.0)
MCV: 84.2 fL (ref 80.0–100.0)
Monocytes Absolute: 0.3 10*3/uL (ref 0.1–1.0)
Monocytes Relative: 10 %
Neutro Abs: 1.7 10*3/uL (ref 1.7–7.7)
Neutrophils Relative %: 50 %
Platelet Count: 143 10*3/uL — ABNORMAL LOW (ref 150–400)
RBC: 4.23 MIL/uL (ref 3.87–5.11)
RDW: 13 % (ref 11.5–15.5)
WBC Count: 3.4 10*3/uL — ABNORMAL LOW (ref 4.0–10.5)
nRBC: 0 % (ref 0.0–0.2)

## 2022-05-31 MED ORDER — FULVESTRANT 250 MG/5ML IM SOSY
500.0000 mg | PREFILLED_SYRINGE | Freq: Once | INTRAMUSCULAR | Status: AC
  Administered 2022-05-31: 500 mg via INTRAMUSCULAR
  Filled 2022-05-31: qty 10

## 2022-05-31 NOTE — Patient Instructions (Signed)

## 2022-06-15 ENCOUNTER — Other Ambulatory Visit: Payer: Self-pay | Admitting: Neurology

## 2022-06-27 ENCOUNTER — Other Ambulatory Visit: Payer: Self-pay | Admitting: *Deleted

## 2022-06-27 DIAGNOSIS — D0511 Intraductal carcinoma in situ of right breast: Secondary | ICD-10-CM

## 2022-06-28 ENCOUNTER — Other Ambulatory Visit: Payer: Self-pay

## 2022-06-28 ENCOUNTER — Inpatient Hospital Stay: Payer: Medicare HMO | Attending: Hematology and Oncology

## 2022-06-28 ENCOUNTER — Inpatient Hospital Stay: Payer: Medicare HMO

## 2022-06-28 VITALS — BP 177/59 | HR 73 | Temp 98.2°F | Resp 18

## 2022-06-28 DIAGNOSIS — D0511 Intraductal carcinoma in situ of right breast: Secondary | ICD-10-CM | POA: Insufficient documentation

## 2022-06-28 DIAGNOSIS — Z79899 Other long term (current) drug therapy: Secondary | ICD-10-CM | POA: Diagnosis not present

## 2022-06-28 DIAGNOSIS — Z5111 Encounter for antineoplastic chemotherapy: Secondary | ICD-10-CM | POA: Diagnosis not present

## 2022-06-28 LAB — CMP (CANCER CENTER ONLY)
ALT: 8 U/L (ref 0–44)
AST: 14 U/L — ABNORMAL LOW (ref 15–41)
Albumin: 3.8 g/dL (ref 3.5–5.0)
Alkaline Phosphatase: 58 U/L (ref 38–126)
Anion gap: 5 (ref 5–15)
BUN: 15 mg/dL (ref 8–23)
CO2: 29 mmol/L (ref 22–32)
Calcium: 9.6 mg/dL (ref 8.9–10.3)
Chloride: 107 mmol/L (ref 98–111)
Creatinine: 1.01 mg/dL — ABNORMAL HIGH (ref 0.44–1.00)
GFR, Estimated: >60 mL/min (ref >60)
Glucose, Bld: 94 mg/dL (ref 70–99)
Potassium: 3.3 mmol/L — ABNORMAL LOW (ref 3.5–5.1)
Sodium: 141 mmol/L (ref 135–145)
Total Bilirubin: 1.3 mg/dL — ABNORMAL HIGH (ref 0.3–1.2)
Total Protein: 6.9 g/dL (ref 6.5–8.1)

## 2022-06-28 LAB — CBC WITH DIFFERENTIAL (CANCER CENTER ONLY)
Abs Immature Granulocytes: 0.01 10*3/uL (ref 0.00–0.07)
Basophils Absolute: 0 10*3/uL (ref 0.0–0.1)
Basophils Relative: 1 %
Eosinophils Absolute: 0.1 10*3/uL (ref 0.0–0.5)
Eosinophils Relative: 4 %
HCT: 40.1 % (ref 36.0–46.0)
Hemoglobin: 12.9 g/dL (ref 12.0–15.0)
Immature Granulocytes: 0 %
Lymphocytes Relative: 31 %
Lymphs Abs: 1 10*3/uL (ref 0.7–4.0)
MCH: 27.6 pg (ref 26.0–34.0)
MCHC: 32.2 g/dL (ref 30.0–36.0)
MCV: 85.7 fL (ref 80.0–100.0)
Monocytes Absolute: 0.3 10*3/uL (ref 0.1–1.0)
Monocytes Relative: 8 %
Neutro Abs: 1.8 10*3/uL (ref 1.7–7.7)
Neutrophils Relative %: 56 %
Platelet Count: UNDETERMINED 10*3/uL (ref 150–400)
RBC: 4.68 MIL/uL (ref 3.87–5.11)
RDW: 13.2 % (ref 11.5–15.5)
WBC Count: 3.2 10*3/uL — ABNORMAL LOW (ref 4.0–10.5)
nRBC: 0 % (ref 0.0–0.2)

## 2022-06-28 MED ORDER — FULVESTRANT 250 MG/5ML IM SOSY
500.0000 mg | PREFILLED_SYRINGE | Freq: Once | INTRAMUSCULAR | Status: AC
  Administered 2022-06-28: 500 mg via INTRAMUSCULAR
  Filled 2022-06-28: qty 10

## 2022-07-07 DIAGNOSIS — N183 Chronic kidney disease, stage 3 unspecified: Secondary | ICD-10-CM | POA: Diagnosis not present

## 2022-07-07 DIAGNOSIS — I10 Essential (primary) hypertension: Secondary | ICD-10-CM | POA: Diagnosis not present

## 2022-07-07 DIAGNOSIS — I700 Atherosclerosis of aorta: Secondary | ICD-10-CM | POA: Diagnosis not present

## 2022-07-07 DIAGNOSIS — R7303 Prediabetes: Secondary | ICD-10-CM | POA: Diagnosis not present

## 2022-07-07 DIAGNOSIS — Z23 Encounter for immunization: Secondary | ICD-10-CM | POA: Diagnosis not present

## 2022-07-07 DIAGNOSIS — D0510 Intraductal carcinoma in situ of unspecified breast: Secondary | ICD-10-CM | POA: Diagnosis not present

## 2022-07-07 DIAGNOSIS — R634 Abnormal weight loss: Secondary | ICD-10-CM | POA: Diagnosis not present

## 2022-07-07 DIAGNOSIS — G309 Alzheimer's disease, unspecified: Secondary | ICD-10-CM | POA: Diagnosis not present

## 2022-07-07 DIAGNOSIS — E7800 Pure hypercholesterolemia, unspecified: Secondary | ICD-10-CM | POA: Diagnosis not present

## 2022-07-26 ENCOUNTER — Other Ambulatory Visit: Payer: Self-pay | Admitting: *Deleted

## 2022-07-26 ENCOUNTER — Inpatient Hospital Stay: Payer: Medicare HMO | Attending: Hematology and Oncology

## 2022-07-26 ENCOUNTER — Inpatient Hospital Stay: Payer: Medicare HMO

## 2022-07-26 VITALS — BP 170/84 | HR 68 | Temp 98.6°F | Resp 16

## 2022-07-26 DIAGNOSIS — D0511 Intraductal carcinoma in situ of right breast: Secondary | ICD-10-CM

## 2022-07-26 DIAGNOSIS — Z79899 Other long term (current) drug therapy: Secondary | ICD-10-CM | POA: Insufficient documentation

## 2022-07-26 DIAGNOSIS — Z5111 Encounter for antineoplastic chemotherapy: Secondary | ICD-10-CM | POA: Insufficient documentation

## 2022-07-26 LAB — CMP (CANCER CENTER ONLY)
ALT: 11 U/L (ref 0–44)
AST: 13 U/L — ABNORMAL LOW (ref 15–41)
Albumin: 3.5 g/dL (ref 3.5–5.0)
Alkaline Phosphatase: 64 U/L (ref 38–126)
Anion gap: 3 — ABNORMAL LOW (ref 5–15)
BUN: 13 mg/dL (ref 8–23)
CO2: 31 mmol/L (ref 22–32)
Calcium: 9.7 mg/dL (ref 8.9–10.3)
Chloride: 107 mmol/L (ref 98–111)
Creatinine: 1.04 mg/dL — ABNORMAL HIGH (ref 0.44–1.00)
GFR, Estimated: 59 mL/min — ABNORMAL LOW (ref >60)
Glucose, Bld: 86 mg/dL (ref 70–99)
Potassium: 4.2 mmol/L (ref 3.5–5.1)
Sodium: 141 mmol/L (ref 135–145)
Total Bilirubin: 0.7 mg/dL (ref 0.3–1.2)
Total Protein: 6.4 g/dL — ABNORMAL LOW (ref 6.5–8.1)

## 2022-07-26 LAB — CBC WITH DIFFERENTIAL (CANCER CENTER ONLY)
Abs Immature Granulocytes: 0.01 10*3/uL (ref 0.00–0.07)
Basophils Absolute: 0 10*3/uL (ref 0.0–0.1)
Basophils Relative: 1 %
Eosinophils Absolute: 0.2 10*3/uL (ref 0.0–0.5)
Eosinophils Relative: 5 %
HCT: 35.8 % — ABNORMAL LOW (ref 36.0–46.0)
Hemoglobin: 11.8 g/dL — ABNORMAL LOW (ref 12.0–15.0)
Immature Granulocytes: 0 %
Lymphocytes Relative: 29 %
Lymphs Abs: 1 10*3/uL (ref 0.7–4.0)
MCH: 28.3 pg (ref 26.0–34.0)
MCHC: 33 g/dL (ref 30.0–36.0)
MCV: 85.9 fL (ref 80.0–100.0)
Monocytes Absolute: 0.5 10*3/uL (ref 0.1–1.0)
Monocytes Relative: 14 %
Neutro Abs: 1.7 10*3/uL (ref 1.7–7.7)
Neutrophils Relative %: 51 %
Platelet Count: 169 10*3/uL (ref 150–400)
RBC: 4.17 MIL/uL (ref 3.87–5.11)
RDW: 13.3 % (ref 11.5–15.5)
WBC Count: 3.3 10*3/uL — ABNORMAL LOW (ref 4.0–10.5)
nRBC: 0 % (ref 0.0–0.2)

## 2022-07-26 MED ORDER — FULVESTRANT 250 MG/5ML IM SOSY
500.0000 mg | PREFILLED_SYRINGE | Freq: Once | INTRAMUSCULAR | Status: AC
  Administered 2022-07-26: 500 mg via INTRAMUSCULAR
  Filled 2022-07-26: qty 10

## 2022-08-22 ENCOUNTER — Other Ambulatory Visit: Payer: Self-pay

## 2022-08-22 DIAGNOSIS — D0511 Intraductal carcinoma in situ of right breast: Secondary | ICD-10-CM

## 2022-08-23 ENCOUNTER — Inpatient Hospital Stay: Payer: Medicare HMO | Attending: Hematology and Oncology

## 2022-08-23 ENCOUNTER — Inpatient Hospital Stay: Payer: Medicare HMO

## 2022-08-23 ENCOUNTER — Other Ambulatory Visit: Payer: Self-pay

## 2022-08-23 VITALS — BP 181/81 | HR 58 | Temp 98.1°F | Resp 18

## 2022-08-23 DIAGNOSIS — R635 Abnormal weight gain: Secondary | ICD-10-CM | POA: Insufficient documentation

## 2022-08-23 DIAGNOSIS — Z5111 Encounter for antineoplastic chemotherapy: Secondary | ICD-10-CM | POA: Diagnosis not present

## 2022-08-23 DIAGNOSIS — Z79899 Other long term (current) drug therapy: Secondary | ICD-10-CM | POA: Insufficient documentation

## 2022-08-23 DIAGNOSIS — D0511 Intraductal carcinoma in situ of right breast: Secondary | ICD-10-CM | POA: Insufficient documentation

## 2022-08-23 LAB — CMP (CANCER CENTER ONLY)
ALT: 15 U/L (ref 0–44)
AST: 20 U/L (ref 15–41)
Albumin: 3.4 g/dL — ABNORMAL LOW (ref 3.5–5.0)
Alkaline Phosphatase: 62 U/L (ref 38–126)
Anion gap: 4 — ABNORMAL LOW (ref 5–15)
BUN: 10 mg/dL (ref 8–23)
CO2: 28 mmol/L (ref 22–32)
Calcium: 9.4 mg/dL (ref 8.9–10.3)
Chloride: 110 mmol/L (ref 98–111)
Creatinine: 0.9 mg/dL (ref 0.44–1.00)
GFR, Estimated: >60 mL/min (ref >60)
Glucose, Bld: 75 mg/dL (ref 70–99)
Potassium: 3.8 mmol/L (ref 3.5–5.1)
Sodium: 142 mmol/L (ref 135–145)
Total Bilirubin: 0.9 mg/dL (ref 0.3–1.2)
Total Protein: 6.4 g/dL — ABNORMAL LOW (ref 6.5–8.1)

## 2022-08-23 LAB — CBC WITH DIFFERENTIAL (CANCER CENTER ONLY)
Abs Immature Granulocytes: 0 10*3/uL (ref 0.00–0.07)
Basophils Absolute: 0 10*3/uL (ref 0.0–0.1)
Basophils Relative: 0 %
Eosinophils Absolute: 0.1 10*3/uL (ref 0.0–0.5)
Eosinophils Relative: 4 %
HCT: 37.1 % (ref 36.0–46.0)
Hemoglobin: 12.1 g/dL (ref 12.0–15.0)
Immature Granulocytes: 0 %
Lymphocytes Relative: 31 %
Lymphs Abs: 1 10*3/uL (ref 0.7–4.0)
MCH: 28.5 pg (ref 26.0–34.0)
MCHC: 32.6 g/dL (ref 30.0–36.0)
MCV: 87.3 fL (ref 80.0–100.0)
Monocytes Absolute: 0.4 10*3/uL (ref 0.1–1.0)
Monocytes Relative: 11 %
Neutro Abs: 1.7 10*3/uL (ref 1.7–7.7)
Neutrophils Relative %: 54 %
Platelet Count: 160 10*3/uL (ref 150–400)
RBC: 4.25 MIL/uL (ref 3.87–5.11)
RDW: 12.9 % (ref 11.5–15.5)
WBC Count: 3.2 10*3/uL — ABNORMAL LOW (ref 4.0–10.5)
nRBC: 0 % (ref 0.0–0.2)

## 2022-08-23 MED ORDER — FULVESTRANT 250 MG/5ML IM SOSY
500.0000 mg | PREFILLED_SYRINGE | Freq: Once | INTRAMUSCULAR | Status: AC
  Administered 2022-08-23: 500 mg via INTRAMUSCULAR
  Filled 2022-08-23: qty 10

## 2022-09-19 ENCOUNTER — Other Ambulatory Visit: Payer: Self-pay

## 2022-09-19 DIAGNOSIS — D0511 Intraductal carcinoma in situ of right breast: Secondary | ICD-10-CM

## 2022-09-20 ENCOUNTER — Inpatient Hospital Stay: Payer: Medicare HMO | Attending: Hematology and Oncology

## 2022-09-20 ENCOUNTER — Other Ambulatory Visit: Payer: Self-pay

## 2022-09-20 ENCOUNTER — Inpatient Hospital Stay: Payer: Medicare HMO

## 2022-09-20 VITALS — BP 157/86 | HR 66 | Temp 98.4°F | Resp 16

## 2022-09-20 DIAGNOSIS — I10 Essential (primary) hypertension: Secondary | ICD-10-CM | POA: Diagnosis not present

## 2022-09-20 DIAGNOSIS — Z5111 Encounter for antineoplastic chemotherapy: Secondary | ICD-10-CM | POA: Insufficient documentation

## 2022-09-20 DIAGNOSIS — D0511 Intraductal carcinoma in situ of right breast: Secondary | ICD-10-CM | POA: Insufficient documentation

## 2022-09-20 DIAGNOSIS — Z79899 Other long term (current) drug therapy: Secondary | ICD-10-CM | POA: Insufficient documentation

## 2022-09-20 LAB — CMP (CANCER CENTER ONLY)
ALT: 12 U/L (ref 0–44)
AST: 19 U/L (ref 15–41)
Albumin: 2.9 g/dL — ABNORMAL LOW (ref 3.5–5.0)
Alkaline Phosphatase: 61 U/L (ref 38–126)
Anion gap: 6 (ref 5–15)
BUN: 9 mg/dL (ref 8–23)
CO2: 26 mmol/L (ref 22–32)
Calcium: 9.3 mg/dL (ref 8.9–10.3)
Chloride: 108 mmol/L (ref 98–111)
Creatinine: 0.99 mg/dL (ref 0.44–1.00)
GFR, Estimated: >60 mL/min (ref >60)
Glucose, Bld: 75 mg/dL (ref 70–99)
Potassium: 3.7 mmol/L (ref 3.5–5.1)
Sodium: 140 mmol/L (ref 135–145)
Total Bilirubin: 1.2 mg/dL (ref 0.3–1.2)
Total Protein: 6 g/dL — ABNORMAL LOW (ref 6.5–8.1)

## 2022-09-20 LAB — CBC WITH DIFFERENTIAL (CANCER CENTER ONLY)
Abs Immature Granulocytes: 0 10*3/uL (ref 0.00–0.07)
Basophils Absolute: 0 10*3/uL (ref 0.0–0.1)
Basophils Relative: 1 %
Eosinophils Absolute: 0.1 10*3/uL (ref 0.0–0.5)
Eosinophils Relative: 4 %
HCT: 38.6 % (ref 36.0–46.0)
Hemoglobin: 12.4 g/dL (ref 12.0–15.0)
Immature Granulocytes: 0 %
Lymphocytes Relative: 38 %
Lymphs Abs: 1.3 10*3/uL (ref 0.7–4.0)
MCH: 27.9 pg (ref 26.0–34.0)
MCHC: 32.1 g/dL (ref 30.0–36.0)
MCV: 86.9 fL (ref 80.0–100.0)
Monocytes Absolute: 0.3 10*3/uL (ref 0.1–1.0)
Monocytes Relative: 10 %
Neutro Abs: 1.6 10*3/uL — ABNORMAL LOW (ref 1.7–7.7)
Neutrophils Relative %: 47 %
Platelet Count: 166 10*3/uL (ref 150–400)
RBC: 4.44 MIL/uL (ref 3.87–5.11)
RDW: 12.6 % (ref 11.5–15.5)
WBC Count: 3.4 10*3/uL — ABNORMAL LOW (ref 4.0–10.5)
nRBC: 0 % (ref 0.0–0.2)

## 2022-09-20 MED ORDER — FULVESTRANT 250 MG/5ML IM SOSY
500.0000 mg | PREFILLED_SYRINGE | Freq: Once | INTRAMUSCULAR | Status: AC
  Administered 2022-09-20: 500 mg via INTRAMUSCULAR
  Filled 2022-09-20: qty 10

## 2022-10-13 ENCOUNTER — Encounter: Payer: Self-pay | Admitting: Physician Assistant

## 2022-10-13 ENCOUNTER — Ambulatory Visit: Payer: Medicare HMO | Admitting: Physician Assistant

## 2022-10-13 VITALS — BP 196/91 | HR 97 | Ht 67.0 in | Wt 128.0 lb

## 2022-10-13 DIAGNOSIS — G300 Alzheimer's disease with early onset: Secondary | ICD-10-CM | POA: Diagnosis not present

## 2022-10-13 DIAGNOSIS — F0280 Dementia in other diseases classified elsewhere without behavioral disturbance: Secondary | ICD-10-CM

## 2022-10-13 MED ORDER — MEMANTINE HCL 10 MG PO TABS: ORAL_TABLET | ORAL | 3 refills | Status: DC

## 2022-10-13 MED ORDER — DONEPEZIL HCL 10 MG PO TABS: ORAL_TABLET | ORAL | 3 refills | Status: DC

## 2022-10-13 NOTE — Patient Instructions (Addendum)
Continue Donepezil '10mg'$  daily  Continue Memantine '10mg'$  tablets twice daily.    WATCH THE BLOOD PRESSURE IS HIGH TODAY, recommend that husband takes over the medicines   3. Follow-up in 6 months, call for any changes   FALL PRECAUTIONS: Be cautious when walking. Scan the area for obstacles that may increase the risk of trips and falls. When getting up in the mornings, sit up at the edge of the bed for a few minutes before getting out of bed. Consider elevating the bed at the head end to avoid drop of blood pressure when getting up. Walk always in a well-lit room (use night lights in the walls). Avoid area rugs or power cords from appliances in the middle of the walkways. Use a walker or a cane if necessary and consider physical therapy for balance exercise. Get your eyesight checked regularly.  FINANCIAL OVERSIGHT: Supervision, especially oversight when making financial decisions or transactions is also recommended.  HOME SAFETY: Consider the safety of the kitchen when operating appliances like stoves, microwave oven, and blender. Consider having supervision and share cooking responsibilities until no longer able to participate in those. Accidents with firearms and other hazards in the house should be identified and addressed as well.  ABILITY TO BE LEFT ALONE: If patient is unable to contact 911 operator, consider using LifeLine, or when the need is there, arrange for someone to stay with patients. Smoking is a fire hazard, consider supervision or cessation. Risk of wandering should be assessed by caregiver and if detected at any point, supervision and safe proof recommendations should be instituted.  MEDICATION SUPERVISION: Inability to self-administer medication needs to be constantly addressed. Implement a mechanism to ensure safe administration of the medications.  RECOMMENDATIONS FOR ALL PATIENTS WITH MEMORY PROBLEMS: 1. Continue to exercise (Recommend 30 minutes of walking everyday, or 3  hours every week) 2. Increase social interactions - continue going to [place] and enjoy social gatherings with friends and family 3. Eat healthy, avoid fried foods and eat more fruits and vegetables 4. Maintain adequate blood pressure, blood sugar, and blood cholesterol level. Reducing the risk of stroke and cardiovascular disease also helps promoting better memory. 5. Avoid stressful situations. Live a simple life and avoid aggravations. Organize your time and prepare for the next day in anticipation. 6. Sleep well, avoid any interruptions of sleep and avoid any distractions in the bedroom that may interfere with adequate sleep quality 7. Avoid sugar, avoid sweets as there is a strong link between excessive sugar intake, diabetes, and cognitive impairment The Mediterranean diet has been shown to help patients reduce the risk of progressive memory disorders and reduces cardiovascular risk. This includes eating fish, eat fruits and green leafy vegetables, nuts like almonds and hazelnuts, walnuts, and also use olive oil. Avoid fast foods and fried foods as much as possible. Avoid sweets and sugar as sugar use has been linked to worsening of memory function.  There is always a concern of gradual progression of memory problems. If this is the case, then we may need to adjust level of care according to patient needs. Support, both to the patient and caregiver, should then be put into place.
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
## 2022-10-18 ENCOUNTER — Encounter: Payer: Self-pay | Admitting: Hematology and Oncology

## 2022-10-18 ENCOUNTER — Inpatient Hospital Stay: Payer: Medicare HMO | Attending: Hematology and Oncology | Admitting: Hematology and Oncology

## 2022-10-18 ENCOUNTER — Inpatient Hospital Stay: Payer: Medicare HMO

## 2022-10-18 ENCOUNTER — Other Ambulatory Visit: Payer: Self-pay

## 2022-10-18 VITALS — BP 162/86 | HR 66 | Temp 97.6°F | Resp 16 | Wt 118.1 lb

## 2022-10-18 DIAGNOSIS — Z79811 Long term (current) use of aromatase inhibitors: Secondary | ICD-10-CM | POA: Insufficient documentation

## 2022-10-18 DIAGNOSIS — M8580 Other specified disorders of bone density and structure, unspecified site: Secondary | ICD-10-CM | POA: Insufficient documentation

## 2022-10-18 DIAGNOSIS — D0511 Intraductal carcinoma in situ of right breast: Secondary | ICD-10-CM

## 2022-10-18 DIAGNOSIS — Z8249 Family history of ischemic heart disease and other diseases of the circulatory system: Secondary | ICD-10-CM | POA: Insufficient documentation

## 2022-10-18 DIAGNOSIS — Z79899 Other long term (current) drug therapy: Secondary | ICD-10-CM | POA: Insufficient documentation

## 2022-10-18 DIAGNOSIS — I10 Essential (primary) hypertension: Secondary | ICD-10-CM | POA: Insufficient documentation

## 2022-10-18 DIAGNOSIS — Z5111 Encounter for antineoplastic chemotherapy: Secondary | ICD-10-CM | POA: Diagnosis not present

## 2022-10-18 DIAGNOSIS — Z9012 Acquired absence of left breast and nipple: Secondary | ICD-10-CM | POA: Insufficient documentation

## 2022-10-18 LAB — CMP (CANCER CENTER ONLY)
ALT: 11 U/L (ref 0–44)
AST: 17 U/L (ref 15–41)
Albumin: 3.9 g/dL (ref 3.5–5.0)
Alkaline Phosphatase: 78 U/L (ref 38–126)
Anion gap: 7 (ref 5–15)
BUN: 20 mg/dL (ref 8–23)
CO2: 29 mmol/L (ref 22–32)
Calcium: 10.4 mg/dL — ABNORMAL HIGH (ref 8.9–10.3)
Chloride: 105 mmol/L (ref 98–111)
Creatinine: 0.98 mg/dL (ref 0.44–1.00)
GFR, Estimated: >60 mL/min (ref >60)
Glucose, Bld: 87 mg/dL (ref 70–99)
Potassium: 3.5 mmol/L (ref 3.5–5.1)
Sodium: 141 mmol/L (ref 135–145)
Total Bilirubin: 2.3 mg/dL — ABNORMAL HIGH (ref 0.3–1.2)
Total Protein: 7.4 g/dL (ref 6.5–8.1)

## 2022-10-18 LAB — CBC WITH DIFFERENTIAL (CANCER CENTER ONLY)
Abs Immature Granulocytes: 0.01 10*3/uL (ref 0.00–0.07)
Basophils Absolute: 0 10*3/uL (ref 0.0–0.1)
Basophils Relative: 1 %
Eosinophils Absolute: 0.1 10*3/uL (ref 0.0–0.5)
Eosinophils Relative: 1 %
HCT: 44.2 % (ref 36.0–46.0)
Hemoglobin: 14.4 g/dL (ref 12.0–15.0)
Immature Granulocytes: 0 %
Lymphocytes Relative: 17 %
Lymphs Abs: 1.1 10*3/uL (ref 0.7–4.0)
MCH: 27.6 pg (ref 26.0–34.0)
MCHC: 32.6 g/dL (ref 30.0–36.0)
MCV: 84.7 fL (ref 80.0–100.0)
Monocytes Absolute: 0.5 10*3/uL (ref 0.1–1.0)
Monocytes Relative: 7 %
Neutro Abs: 4.8 10*3/uL (ref 1.7–7.7)
Neutrophils Relative %: 74 %
Platelet Count: 221 10*3/uL (ref 150–400)
RBC: 5.22 MIL/uL — ABNORMAL HIGH (ref 3.87–5.11)
RDW: 11.9 % (ref 11.5–15.5)
WBC Count: 6.5 10*3/uL (ref 4.0–10.5)
nRBC: 0 % (ref 0.0–0.2)

## 2022-10-18 MED ORDER — FULVESTRANT 250 MG/5ML IM SOSY
500.0000 mg | PREFILLED_SYRINGE | Freq: Once | INTRAMUSCULAR | Status: AC
  Administered 2022-10-18: 500 mg via INTRAMUSCULAR
  Filled 2022-10-18: qty 10

## 2022-10-18 NOTE — Progress Notes (Signed)
BRIEF ONCOLOGIC HISTORY:  Oncology History  Ductal carcinoma in situ of right breast  12/14/2020 Initial Diagnosis   Right breast diagnostic mammogram done on December 14, 2020 showed a 0.5 cm group of round calcifications in the right breast at 930, 9 cm from the nipple.  There is a second stable group of 5 round calcifications in the right breast upper outer aspect middle depth.  The 0.5 cm grouped calcifications in the right breast at 9:00 middle depth are increasing in number and stereotactic biopsy is recommended to exclude high risk lesion or DCIS.   Right medial breast core biopsy on December 29, 2020, 9:00, 9 cm from nipple showed sclerotic ductal papilloma with usual ductal epithelial hyperplasia and calcifications.     04/06/2021 Surgery   Right breast lumpectomy showed ductal carcinoma in situ with calcifications, margins not involved, measuring 1.5 cm, low-grade, all margins negative, prognostic showed ER +90%, strong staining, PR positive, 95%, strong staining.    04/06/2021 Cancer Staging   Staging form: Breast, AJCC 8th Edition - Pathologic stage from 04/06/2021: Stage 0 (pTis (DCIS), pN0, cM0, ER+, PR+) - Signed by Gardenia Phlegm, NP on 09/07/2021   05/23/2021 - 06/21/2021 Radiation Therapy   05/23/2021 through 06/21/2021 Site Technique Total Dose (Gy) Dose per Fx (Gy) Completed Fx Beam Energies  Breast, Right: Breast_Rt 3D 40.05/40.05 2.67 15/15 6X  Breast, Right: Breast_Rt_Bst 3D 10/10 2 5/5 6X    07/2021 -  Anti-estrogen oral therapy   Anastrozole daily     INTERVAL HISTORY:   She is here for follow up with her husband. She is now on faslodex for anti estrogen therapy. She says it hurts a little but otherwise she can't think of any side effects. No change in breathing or bowel habits.  No new bone pains. She doesn't provide reliable history, her husband apparently left to get some coffee and was not with her. She had her last mammogram in May 2023 at [facility] which showed  the calcifications in the left breast. Rest of the pertinent 10 point ROS reviewed and negative.  REVIEW OF SYSTEMS:  Review of Systems  Constitutional:  Positive for appetite change and unexpected weight change (weight has increased). Negative for chills, fatigue and fever.  HENT:   Negative for hearing loss, lump/mass and trouble swallowing.   Eyes:  Negative for eye problems and icterus.  Respiratory:  Negative for chest tightness, cough and shortness of breath.   Cardiovascular:  Negative for chest pain, leg swelling and palpitations.  Gastrointestinal:  Negative for abdominal distention, abdominal pain, constipation, diarrhea, nausea and vomiting.  Endocrine: Negative for hot flashes.  Genitourinary:  Negative for difficulty urinating.   Musculoskeletal:  Negative for arthralgias.  Skin:  Negative for itching and rash.  Neurological:  Negative for dizziness, extremity weakness, headaches and numbness.  Hematological:  Negative for adenopathy. Does not bruise/bleed easily.  Psychiatric/Behavioral:  Negative for depression. The patient is not nervous/anxious.    Breast: Denies any new nodularity, masses, tenderness, nipple changes, or nipple discharge.    ONCOLOGY TREATMENT TEAM:  1. Surgeon:  Dr. Ninfa Linden at [facility] 2. Medical Oncologist: Dr. Chryl Heck  3. Radiation Oncologist: Dr. Sondra Come    PAST MEDICAL/SURGICAL HISTORY:  Past Medical History:  Diagnosis Date   Breast cancer ([place])    Concussion 02/27/2018   Dementia ([place])    History of radiation therapy    Right breast- 05/23/21-06/21/21- Dr. Gery Pray   Hyperlipidemia    Hypertension  Past Surgical History:  Procedure Laterality Date   BREAST LUMPECTOMY WITH RADIOACTIVE SEED LOCALIZATION Right 03/31/2021   Procedure: RIGHT BREAST LUMPECTOMY WITH RADIOACTIVE SEED LOCALIZATION;  Surgeon: Coralie Keens, MD;  Location: [place];  Service: General;  Laterality: Right;   NO PAST  SURGERIES     SIMPLE MASTECTOMY WITH AXILLARY SENTINEL NODE BIOPSY Left 03/29/2022   Procedure: LEFT MASTECTOMY;  Surgeon: Coralie Keens, MD;  Location: [place];  Service: General;  Laterality: Left;  LMA     ALLERGIES:  Allergies  Allergen Reactions   Ace Inhibitors     Other reaction(s): Cough   Metoprolol Tartrate Other (See Comments)     CURRENT MEDICATIONS:  Outpatient Encounter Medications as of 10/18/2022  Medication Sig   amLODipine (NORVASC) 10 MG tablet Take 10 mg by mouth daily.   atorvastatin (LIPITOR) 40 MG tablet Take 40 mg by mouth daily.   donepezil (ARICEPT) 10 MG tablet TAKE 1 TABLET BY MOUTH EVERY DAY   memantine (NAMENDA) 10 MG tablet Take 1 tablet two times a day   metoprolol succinate (TOPROL-XL) 100 MG 24 hr tablet Take 100 mg by mouth daily.   No facility-administered encounter medications on file as of 10/18/2022.     ONCOLOGIC FAMILY HISTORY:  Family History  Problem Relation Age of Onset   Hypertension Other    Dementia Neg Hx      GENETIC COUNSELING/TESTING: Not at this time  SOCIAL HISTORY:  Social History   Socioeconomic History   Marital status: Married    Spouse name: Not on file   Number of children: Not on file   Years of education: 12   Highest education level: High school graduate  Occupational History   Occupation: Retired  Tobacco Use   Smoking status: Never   Smokeless tobacco: Never  Scientific laboratory technician Use: Not on file  Substance and Sexual Activity   Alcohol use: No   Drug use: Never   Sexual activity: Not Currently    Partners: Male    Birth control/protection: Post-menopausal  Other Topics Concern   Not on file  Social History Narrative   Lives with husband and two dogs      Right handed      Split level home      Highest level of edu- 12th grade   Social Determinants of Health   Financial Resource Strain: Low Risk  (09/08/2021)   Overall Financial Resource Strain (CARDIA)     Difficulty of Paying Living Expenses: Not hard at all  Food Insecurity: No Food Insecurity (09/08/2021)   Hunger Vital Sign    Worried About Running Out of Food in the Last Year: Never true    [place] in the Last Year: Never true  Transportation Needs: No Transportation Needs (09/08/2021)   PRAPARE - Hydrologist (Medical): No    Lack of Transportation (Non-Medical): No  Physical Activity: Insufficiently Active (09/08/2021)   Exercise Vital Sign    Days of Exercise per Week: 1 day    Minutes of Exercise per Session: 60 min  Stress: No Stress Concern Present (09/08/2021)   [place]    Feeling of Stress : Not at all  Social Connections: Moderately Integrated (09/08/2021)   Social Connection and Isolation Panel [NHANES]    Frequency of Communication with Friends and Family: More than three times a week    Frequency of  Social Gatherings with Friends and Family: More than three times a week    Attends Religious Services: 1 to 4 times per year    Active Member of [employer] or Organizations: No    Attends Archivist Meetings: Never    Marital Status: Married  Human resources officer Violence: Not At Risk (09/08/2021)   Humiliation, Afraid, Rape, and Kick questionnaire    Fear of Current or Ex-Partner: No    Emotionally Abused: No    Physically Abused: No    Sexually Abused: No     OBSERVATIONS/OBJECTIVE:  There were no vitals taken for this visit.  GENERAL: Patient is a well appearing female in no acute distress HEENT:  Sclerae anicteric.   NODES:  No cervical, supraclavicular, or axillary lymphadenopathy palpated.  BREAST EXAM: Status post left mastectomy, surgical scar appears well.  Right breast also normal to inspection and palpation.  No regional adenopathy.  LABORATORY DATA:  None for this visit.  DIAGNOSTIC IMAGING:  None for this visit.   ASSESSMENT AND PLAN:   Ms..  Pardo is a pleasant 72 y.o. female with Stage 0 right breast ductal carcinoma in situ, estrogen and progesterone positive, diagnosed in July 2022, treated with lumpectomy, adjuvant radiation therapy, and antiestrogen therapy with anastrozole beginning in October 2022.    She then was diagnosed with left breast DCIS with calcifications measuring greater than 7 cm hence had mastectomy and  had Left breast mastectomy once again showed isolated scattered foci of biopsy site with clear margins of resection, no evidence of invasive carcinoma, prior prognostic showed ER 100% PR 1%. Since she was on anastrozole at the time of this DCIS, we have recommended Faslodex for antiestrogen therapy.  She may not be a good candidate for tamoxifen given her sedentary lifestyle.  She will continue Faslodex for adjuvant antiestrogen therapy.  She denies any complaints except for some pain at the site of injection.  No concerns on exam today.  Breast exam deferred.  Husband not present with her at the time of our conversation.  She unfortunately cannot remember most things and is not a reliable historian.  She will proceed with mammogram in May 2024.  She does this at [facility]. She will return to clinic in 6 months  Last bone density showed osteopenia, she is not a candidate for bisphosphonates.  We in the past have discussed about exercising and some vitamin D supplementation and continue to monitor.  Total time spent : 30 minutes.  *Total Encounter Time as defined by the Centers for Medicare and Medicaid Services includes, in addition to the face-to-face time of a patient visit (documented in the note above) non-face-to-face time: obtaining and reviewing outside history, ordering and reviewing medications, tests or procedures, care coordination (communications with other health care professionals or caregivers) and documentation in the medical record.

## 2022-12-19 DIAGNOSIS — Z0000 Encounter for general adult medical examination without abnormal findings: Secondary | ICD-10-CM | POA: Diagnosis not present

## 2022-12-19 DIAGNOSIS — I10 Essential (primary) hypertension: Secondary | ICD-10-CM | POA: Diagnosis not present

## 2022-12-19 DIAGNOSIS — F0280 Dementia in other diseases classified elsewhere without behavioral disturbance: Secondary | ICD-10-CM | POA: Diagnosis not present

## 2022-12-19 DIAGNOSIS — R7303 Prediabetes: Secondary | ICD-10-CM | POA: Diagnosis not present

## 2022-12-19 DIAGNOSIS — E7800 Pure hypercholesterolemia, unspecified: Secondary | ICD-10-CM | POA: Diagnosis not present

## 2022-12-19 DIAGNOSIS — Z1331 Encounter for screening for depression: Secondary | ICD-10-CM | POA: Diagnosis not present

## 2022-12-19 DIAGNOSIS — D0510 Intraductal carcinoma in situ of unspecified breast: Secondary | ICD-10-CM | POA: Diagnosis not present

## 2022-12-19 DIAGNOSIS — G309 Alzheimer's disease, unspecified: Secondary | ICD-10-CM | POA: Diagnosis not present

## 2022-12-19 DIAGNOSIS — I700 Atherosclerosis of aorta: Secondary | ICD-10-CM | POA: Diagnosis not present

## 2023-01-12 DIAGNOSIS — Z1231 Encounter for screening mammogram for malignant neoplasm of breast: Secondary | ICD-10-CM | POA: Diagnosis not present

## 2023-04-13 ENCOUNTER — Encounter: Payer: Self-pay | Admitting: Physician Assistant

## 2023-04-13 ENCOUNTER — Ambulatory Visit: Payer: Medicare HMO | Admitting: Physician Assistant

## 2023-04-13 VITALS — BP 120/80 | HR 69 | Resp 20 | Ht 67.0 in | Wt 143.0 lb

## 2023-04-13 DIAGNOSIS — F0280 Dementia in other diseases classified elsewhere without behavioral disturbance: Secondary | ICD-10-CM | POA: Diagnosis not present

## 2023-04-13 DIAGNOSIS — G300 Alzheimer's disease with early onset: Secondary | ICD-10-CM | POA: Diagnosis not present
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
## 2023-04-13 NOTE — Patient Instructions (Signed)
Continue Donepezil 10mg  daily  Continue Memantine 10mg  tablets twice daily.       3. Follow-up in 6 months    FALL PRECAUTIONS: Be cautious when walking. Scan the area for obstacles that may increase the risk of trips and falls. When getting up in the mornings, sit up at the edge of the bed for a few minutes before getting out of bed. Consider elevating the bed at the head end to avoid drop of blood pressure when getting up. Walk always in a well-lit room (use night lights in the walls). Avoid area rugs or power cords from appliances in the middle of the walkways. Use a walker or a cane if necessary and consider physical therapy for balance exercise. Get your eyesight checked regularly.  FINANCIAL OVERSIGHT: Supervision, especially oversight when making financial decisions or transactions is also recommended.  HOME SAFETY: Consider the safety of the kitchen when operating appliances like stoves, microwave oven, and blender. Consider having supervision and share cooking responsibilities until no longer able to participate in those. Accidents with firearms and other hazards in the house should be identified and addressed as well.  ABILITY TO BE LEFT ALONE: If patient is unable to contact 911 operator, consider using LifeLine, or when the need is there, arrange for someone to stay with patients. Smoking is a fire hazard, consider supervision or cessation. Risk of wandering should be assessed by caregiver and if detected at any point, supervision and safe proof recommendations should be instituted.  MEDICATION SUPERVISION: Inability to self-administer medication needs to be constantly addressed. Implement a mechanism to ensure safe administration of the medications.  RECOMMENDATIONS FOR ALL PATIENTS WITH MEMORY PROBLEMS: 1. Continue to exercise (Recommend 30 minutes of walking everyday, or 3 hours every week) 2. Increase social interactions - continue going to [place] and enjoy social gatherings with  friends and family 3. Eat healthy, avoid fried foods and eat more fruits and vegetables 4. Maintain adequate blood pressure, blood sugar, and blood cholesterol level. Reducing the risk of stroke and cardiovascular disease also helps promoting better memory. 5. Avoid stressful situations. Live a simple life and avoid aggravations. Organize your time and prepare for the next day in anticipation. 6. Sleep well, avoid any interruptions of sleep and avoid any distractions in the bedroom that may interfere with adequate sleep quality 7. Avoid sugar, avoid sweets as there is a strong link between excessive sugar intake, diabetes, and cognitive impairment The Mediterranean diet has been shown to help patients reduce the risk of progressive memory disorders and reduces cardiovascular risk. This includes eating fish, eat fruits and green leafy vegetables, nuts like almonds and hazelnuts, walnuts, and also use olive oil. Avoid fast foods and fried foods as much as possible. Avoid sweets and sugar as sugar use has been linked to worsening of memory function.  There is always a concern of gradual progression of memory problems. If this is the case, then we may need to adjust level of care according to patient needs. Support, both to the patient and caregiver, should then be put into place.

## 2023-06-13 DIAGNOSIS — N1831 Chronic kidney disease, stage 3a: Secondary | ICD-10-CM | POA: Diagnosis not present

## 2023-06-13 DIAGNOSIS — G300 Alzheimer's disease with early onset: Secondary | ICD-10-CM | POA: Diagnosis not present

## 2023-06-13 DIAGNOSIS — E7800 Pure hypercholesterolemia, unspecified: Secondary | ICD-10-CM | POA: Diagnosis not present

## 2023-06-13 DIAGNOSIS — D0510 Intraductal carcinoma in situ of unspecified breast: Secondary | ICD-10-CM | POA: Diagnosis not present

## 2023-06-13 DIAGNOSIS — F0280 Dementia in other diseases classified elsewhere without behavioral disturbance: Secondary | ICD-10-CM | POA: Diagnosis not present

## 2023-06-13 DIAGNOSIS — Z23 Encounter for immunization: Secondary | ICD-10-CM | POA: Diagnosis not present

## 2023-06-13 DIAGNOSIS — I700 Atherosclerosis of aorta: Secondary | ICD-10-CM | POA: Diagnosis not present

## 2023-06-13 DIAGNOSIS — R7303 Prediabetes: Secondary | ICD-10-CM | POA: Diagnosis not present

## 2023-06-13 DIAGNOSIS — I10 Essential (primary) hypertension: Secondary | ICD-10-CM | POA: Diagnosis not present

## 2023-07-05 ENCOUNTER — Telehealth: Payer: Self-pay | Admitting: Physician Assistant

## 2023-07-05 NOTE — Telephone Encounter (Signed)
Caller stated his wife has jury duty on November 8th and he would like a note if possible excusing her from having to serve due to her dementia.

## 2023-07-11 ENCOUNTER — Telehealth: Payer: Self-pay | Admitting: Physician Assistant

## 2023-07-11 ENCOUNTER — Encounter: Payer: Self-pay | Admitting: Physician Assistant

## 2023-07-11 NOTE — Telephone Encounter (Signed)
Patient is needing a note to excuse her from [place] duty. Since she has dementia

## 2023-07-12 NOTE — Telephone Encounter (Signed)
Called an advised note written

## 2023-07-12 NOTE — Telephone Encounter (Signed)
Printed, will call patient and let her know it is ready.

## 2023-09-20 DIAGNOSIS — D0512 Intraductal carcinoma in situ of left breast: Secondary | ICD-10-CM | POA: Diagnosis not present

## 2023-10-16 ENCOUNTER — Ambulatory Visit: Payer: Medicare HMO | Admitting: Physician Assistant

## 2023-10-16 ENCOUNTER — Encounter: Payer: Self-pay | Admitting: Physician Assistant

## 2023-10-25 ENCOUNTER — Ambulatory Visit: Payer: Medicare HMO | Admitting: Physician Assistant

## 2023-10-25 ENCOUNTER — Encounter: Payer: Self-pay | Admitting: Physician Assistant

## 2023-10-25 VITALS — BP 140/68 | HR 78 | Resp 20 | Ht 67.0 in

## 2023-10-25 DIAGNOSIS — G300 Alzheimer's disease with early onset: Secondary | ICD-10-CM

## 2023-10-25 DIAGNOSIS — F0280 Dementia in other diseases classified elsewhere without behavioral disturbance: Secondary | ICD-10-CM | POA: Diagnosis not present

## 2023-10-25 MED ORDER — DONEPEZIL HCL 10 MG PO TABS: ORAL_TABLET | ORAL | 3 refills | Status: DC

## 2023-10-25 MED ORDER — MEMANTINE HCL 10 MG PO TABS: ORAL_TABLET | ORAL | 3 refills | Status: DC

## 2023-10-25 NOTE — Patient Instructions (Signed)
Continue Donepezil 10mg  daily  Continue Memantine 10mg  tablets twice daily.       3. Follow-up in 6 months    FALL PRECAUTIONS: Be cautious when walking. Scan the area for obstacles that may increase the risk of trips and falls. When getting up in the mornings, sit up at the edge of the bed for a few minutes before getting out of bed. Consider elevating the bed at the head end to avoid drop of blood pressure when getting up. Walk always in a well-lit room (use night lights in the walls). Avoid area rugs or power cords from appliances in the middle of the walkways. Use a walker or a cane if necessary and consider physical therapy for balance exercise. Get your eyesight checked regularly.  FINANCIAL OVERSIGHT: Supervision, especially oversight when making financial decisions or transactions is also recommended.  HOME SAFETY: Consider the safety of the kitchen when operating appliances like stoves, microwave oven, and blender. Consider having supervision and share cooking responsibilities until no longer able to participate in those. Accidents with firearms and other hazards in the house should be identified and addressed as well.  ABILITY TO BE LEFT ALONE: If patient is unable to contact 911 operator, consider using LifeLine, or when the need is there, arrange for someone to stay with patients. Smoking is a fire hazard, consider supervision or cessation. Risk of wandering should be assessed by caregiver and if detected at any point, supervision and safe proof recommendations should be instituted.  MEDICATION SUPERVISION: Inability to self-administer medication needs to be constantly addressed. Implement a mechanism to ensure safe administration of the medications.  RECOMMENDATIONS FOR ALL PATIENTS WITH MEMORY PROBLEMS: 1. Continue to exercise (Recommend 30 minutes of walking everyday, or 3 hours every week) 2. Increase social interactions - continue going to [place] and enjoy social gatherings with  friends and family 3. Eat healthy, avoid fried foods and eat more fruits and vegetables 4. Maintain adequate blood pressure, blood sugar, and blood cholesterol level. Reducing the risk of stroke and cardiovascular disease also helps promoting better memory. 5. Avoid stressful situations. Live a simple life and avoid aggravations. Organize your time and prepare for the next day in anticipation. 6. Sleep well, avoid any interruptions of sleep and avoid any distractions in the bedroom that may interfere with adequate sleep quality 7. Avoid sugar, avoid sweets as there is a strong link between excessive sugar intake, diabetes, and cognitive impairment The Mediterranean diet has been shown to help patients reduce the risk of progressive memory disorders and reduces cardiovascular risk. This includes eating fish, eat fruits and green leafy vegetables, nuts like almonds and hazelnuts, walnuts, and also use olive oil. Avoid fast foods and fried foods as much as possible. Avoid sweets and sugar as sugar use has been linked to worsening of memory function.  There is always a concern of gradual progression of memory problems. If this is the case, then we may need to adjust level of care according to patient needs. Support, both to the patient and caregiver, should then be put into place.
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
## 2024-02-27 ENCOUNTER — Encounter: Payer: Self-pay | Admitting: Physician Assistant

## 2024-04-23 ENCOUNTER — Ambulatory Visit: Payer: Medicare HMO | Admitting: Physician Assistant

## 2024-06-03 DIAGNOSIS — I10 Essential (primary) hypertension: Secondary | ICD-10-CM | POA: Diagnosis not present

## 2024-06-03 DIAGNOSIS — N1831 Chronic kidney disease, stage 3a: Secondary | ICD-10-CM | POA: Diagnosis not present

## 2024-06-17 DIAGNOSIS — N183 Chronic kidney disease, stage 3 unspecified: Secondary | ICD-10-CM | POA: Diagnosis not present

## 2024-06-17 DIAGNOSIS — I129 Hypertensive chronic kidney disease with stage 1 through stage 4 chronic kidney disease, or unspecified chronic kidney disease: Secondary | ICD-10-CM | POA: Diagnosis not present

## 2024-06-17 DIAGNOSIS — G309 Alzheimer's disease, unspecified: Secondary | ICD-10-CM | POA: Diagnosis not present

## 2024-06-17 DIAGNOSIS — R634 Abnormal weight loss: Secondary | ICD-10-CM | POA: Diagnosis not present

## 2024-06-17 DIAGNOSIS — R7303 Prediabetes: Secondary | ICD-10-CM | POA: Diagnosis not present

## 2024-06-17 DIAGNOSIS — Z23 Encounter for immunization: Secondary | ICD-10-CM | POA: Diagnosis not present

## 2024-06-17 DIAGNOSIS — E7800 Pure hypercholesterolemia, unspecified: Secondary | ICD-10-CM | POA: Diagnosis not present

## 2024-06-19 ENCOUNTER — Ambulatory Visit: Admitting: Physician Assistant

## 2024-06-19 ENCOUNTER — Encounter: Payer: Self-pay | Admitting: Physician Assistant

## 2024-06-19 VITALS — BP 100/63 | Resp 20 | Ht 67.0 in | Wt 127.0 lb

## 2024-06-19 DIAGNOSIS — F0280 Dementia in other diseases classified elsewhere without behavioral disturbance: Secondary | ICD-10-CM

## 2024-06-19 DIAGNOSIS — G300 Alzheimer's disease with early onset: Secondary | ICD-10-CM

## 2024-06-19 MED ORDER — DONEPEZIL HCL 10 MG PO TABS: ORAL_TABLET | ORAL | 3 refills | Status: AC

## 2024-06-19 MED ORDER — MEMANTINE HCL 10 MG PO TABS: ORAL_TABLET | ORAL | 3 refills | Status: AC
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
## 2024-06-19 NOTE — Patient Instructions (Signed)
Continue Donepezil 10mg  daily  Continue Memantine 10mg  tablets twice daily.       3. Follow-up in 6 months    FALL PRECAUTIONS: Be cautious when walking. Scan the area for obstacles that may increase the risk of trips and falls. When getting up in the mornings, sit up at the edge of the bed for a few minutes before getting out of bed. Consider elevating the bed at the head end to avoid drop of blood pressure when getting up. Walk always in a well-lit room (use night lights in the walls). Avoid area rugs or power cords from appliances in the middle of the walkways. Use a walker or a cane if necessary and consider physical therapy for balance exercise. Get your eyesight checked regularly.  FINANCIAL OVERSIGHT: Supervision, especially oversight when making financial decisions or transactions is also recommended.  HOME SAFETY: Consider the safety of the kitchen when operating appliances like stoves, microwave oven, and blender. Consider having supervision and share cooking responsibilities until no longer able to participate in those. Accidents with firearms and other hazards in the house should be identified and addressed as well.  ABILITY TO BE LEFT ALONE: If patient is unable to contact 911 operator, consider using LifeLine, or when the need is there, arrange for someone to stay with patients. Smoking is a fire hazard, consider supervision or cessation. Risk of wandering should be assessed by caregiver and if detected at any point, supervision and safe proof recommendations should be instituted.  MEDICATION SUPERVISION: Inability to self-administer medication needs to be constantly addressed. Implement a mechanism to ensure safe administration of the medications.  RECOMMENDATIONS FOR ALL PATIENTS WITH MEMORY PROBLEMS: 1. Continue to exercise (Recommend 30 minutes of walking everyday, or 3 hours every week) 2. Increase social interactions - continue going to [place] and enjoy social gatherings with  friends and family 3. Eat healthy, avoid fried foods and eat more fruits and vegetables 4. Maintain adequate blood pressure, blood sugar, and blood cholesterol level. Reducing the risk of stroke and cardiovascular disease also helps promoting better memory. 5. Avoid stressful situations. Live a simple life and avoid aggravations. Organize your time and prepare for the next day in anticipation. 6. Sleep well, avoid any interruptions of sleep and avoid any distractions in the bedroom that may interfere with adequate sleep quality 7. Avoid sugar, avoid sweets as there is a strong link between excessive sugar intake, diabetes, and cognitive impairment The Mediterranean diet has been shown to help patients reduce the risk of progressive memory disorders and reduces cardiovascular risk. This includes eating fish, eat fruits and green leafy vegetables, nuts like almonds and hazelnuts, walnuts, and also use olive oil. Avoid fast foods and fried foods as much as possible. Avoid sweets and sugar as sugar use has been linked to worsening of memory function.  There is always a concern of gradual progression of memory problems. If this is the case, then we may need to adjust level of care according to patient needs. Support, both to the patient and caregiver, should then be put into place.

## 2024-07-01 DIAGNOSIS — G309 Alzheimer's disease, unspecified: Secondary | ICD-10-CM | POA: Diagnosis not present

## 2024-07-01 DIAGNOSIS — D0510 Intraductal carcinoma in situ of unspecified breast: Secondary | ICD-10-CM | POA: Diagnosis not present

## 2024-07-01 DIAGNOSIS — I10 Essential (primary) hypertension: Secondary | ICD-10-CM | POA: Diagnosis not present

## 2024-07-01 DIAGNOSIS — N1831 Chronic kidney disease, stage 3a: Secondary | ICD-10-CM | POA: Diagnosis not present

## 2024-07-01 DIAGNOSIS — G300 Alzheimer's disease with early onset: Secondary | ICD-10-CM | POA: Diagnosis not present

## 2024-07-03 DIAGNOSIS — N1831 Chronic kidney disease, stage 3a: Secondary | ICD-10-CM | POA: Diagnosis not present

## 2024-07-03 DIAGNOSIS — I10 Essential (primary) hypertension: Secondary | ICD-10-CM | POA: Diagnosis not present

## 2024-08-01 DIAGNOSIS — D0510 Intraductal carcinoma in situ of unspecified breast: Secondary | ICD-10-CM | POA: Diagnosis not present

## 2024-08-01 DIAGNOSIS — G309 Alzheimer's disease, unspecified: Secondary | ICD-10-CM | POA: Diagnosis not present

## 2024-08-01 DIAGNOSIS — N1831 Chronic kidney disease, stage 3a: Secondary | ICD-10-CM | POA: Diagnosis not present

## 2024-08-01 DIAGNOSIS — G300 Alzheimer's disease with early onset: Secondary | ICD-10-CM | POA: Diagnosis not present

## 2024-08-01 DIAGNOSIS — I10 Essential (primary) hypertension: Secondary | ICD-10-CM | POA: Diagnosis not present

## 2024-08-02 DIAGNOSIS — I10 Essential (primary) hypertension: Secondary | ICD-10-CM | POA: Diagnosis not present

## 2024-08-02 DIAGNOSIS — N1831 Chronic kidney disease, stage 3a: Secondary | ICD-10-CM | POA: Diagnosis not present

## 2024-08-31 DIAGNOSIS — N1831 Chronic kidney disease, stage 3a: Secondary | ICD-10-CM | POA: Diagnosis not present

## 2024-08-31 DIAGNOSIS — G309 Alzheimer's disease, unspecified: Secondary | ICD-10-CM | POA: Diagnosis not present

## 2024-08-31 DIAGNOSIS — G300 Alzheimer's disease with early onset: Secondary | ICD-10-CM | POA: Diagnosis not present

## 2024-08-31 DIAGNOSIS — D0510 Intraductal carcinoma in situ of unspecified breast: Secondary | ICD-10-CM | POA: Diagnosis not present

## 2024-08-31 DIAGNOSIS — I10 Essential (primary) hypertension: Secondary | ICD-10-CM | POA: Diagnosis not present

## 2024-12-17 ENCOUNTER — Ambulatory Visit: Admitting: Physician Assistant
